# Patient Record
Sex: Male | Born: 1970 | Hispanic: No | Marital: Married | Smoking: Never smoker
Health system: Southern US, Community
[De-identification: ages and names within clinical notes are randomized; demographics above are authoritative.]

## PROBLEM LIST (undated history)

## (undated) DIAGNOSIS — I10 Essential (primary) hypertension: Secondary | ICD-10-CM

## (undated) DIAGNOSIS — E119 Type 2 diabetes mellitus without complications: Secondary | ICD-10-CM

---

## 2009-07-05 ENCOUNTER — Ambulatory Visit: Payer: Self-pay | Admitting: Internal Medicine

## 2009-07-05 DIAGNOSIS — I10 Essential (primary) hypertension: Secondary | ICD-10-CM | POA: Insufficient documentation

## 2009-07-05 LAB — CONVERTED CEMR LAB
Albumin: 4.5 g/dL (ref 3.5–5.2)
BUN: 8 mg/dL (ref 6–23)
Basophils Absolute: 0 10*3/uL (ref 0.0–0.1)
CO2: 30 meq/L (ref 19–32)
Eosinophils Absolute: 0.1 10*3/uL (ref 0.0–0.7)
GFR calc non Af Amer: 115.73 mL/min (ref >60)
Glucose, Bld: 128 mg/dL — ABNORMAL HIGH (ref 70–99)
HCT: 44 % (ref 39.0–52.0)
HDL: 48.2 mg/dL (ref >39.00)
Leukocytes, UA: NEGATIVE
Lymphs Abs: 1.7 10*3/uL (ref 0.7–4.0)
MCHC: 33.6 g/dL (ref 30.0–36.0)
Monocytes Absolute: 0.6 10*3/uL (ref 0.1–1.0)
Monocytes Relative: 8 % (ref 3.0–12.0)
Nitrite: NEGATIVE
Platelets: 295 10*3/uL (ref 150.0–400.0)
Potassium: 4.2 meq/L (ref 3.5–5.1)
RDW: 12.1 % (ref 11.5–14.6)
Specific Gravity, Urine: 1.015 (ref 1.000–1.030)
TSH: 1.22 microintl units/mL (ref 0.35–5.50)
Total Bilirubin: 1 mg/dL (ref 0.3–1.2)
Triglycerides: 241 mg/dL — ABNORMAL HIGH (ref 0.0–149.0)
Urobilinogen, UA: 0.2 (ref 0.0–1.0)
pH: 7 (ref 5.0–8.0)

## 2009-07-27 ENCOUNTER — Telehealth: Payer: Self-pay | Admitting: Internal Medicine

## 2009-08-30 ENCOUNTER — Ambulatory Visit: Payer: Self-pay | Admitting: Internal Medicine

## 2009-08-30 DIAGNOSIS — E781 Pure hyperglyceridemia: Secondary | ICD-10-CM | POA: Insufficient documentation

## 2009-08-30 DIAGNOSIS — E1139 Type 2 diabetes mellitus with other diabetic ophthalmic complication: Secondary | ICD-10-CM | POA: Insufficient documentation

## 2009-08-30 LAB — CONVERTED CEMR LAB
Calcium: 9.3 mg/dL (ref 8.4–10.5)
GFR calc non Af Amer: 117.32 mL/min (ref >60)
Hgb A1c MFr Bld: 7.2 % — ABNORMAL HIGH (ref 4.6–6.5)
Potassium: 4.5 meq/L (ref 3.5–5.1)
Sodium: 139 meq/L (ref 135–145)

## 2010-01-01 ENCOUNTER — Ambulatory Visit: Payer: Self-pay | Admitting: Internal Medicine

## 2010-01-01 LAB — CONVERTED CEMR LAB
ALT: 37 units/L (ref 0–53)
AST: 32 units/L (ref 0–37)
Alkaline Phosphatase: 37 units/L — ABNORMAL LOW (ref 39–117)
BUN: 13 mg/dL (ref 6–23)
Chloride: 105 meq/L (ref 96–112)
GFR calc non Af Amer: 109.1 mL/min (ref >60)
Glucose, Bld: 137 mg/dL — ABNORMAL HIGH (ref 70–99)
HDL goal, serum: 40 mg/dL
HDL: 37 mg/dL — ABNORMAL LOW (ref >39.00)
LDL Goal: 100 mg/dL
Potassium: 4.4 meq/L (ref 3.5–5.1)
Sodium: 140 meq/L (ref 135–145)
TSH: 1.21 microintl units/mL (ref 0.35–5.50)
Total Bilirubin: 0.9 mg/dL (ref 0.3–1.2)
VLDL: 68.6 mg/dL — ABNORMAL HIGH (ref 0.0–40.0)

## 2010-05-21 NOTE — Assessment & Plan Note (Signed)
Summary: f/u appt/#/cd   Vital Signs:  Patient profile:   40 year old male Height:      69 inches Weight:      170 pounds BMI:     25.20 O2 Sat:      98 % on Room air Temp:     97.7 degrees F oral Pulse rate:   79 / minute Pulse rhythm:   regular Resp:     16 per minute BP sitting:   128 / 86  (left arm) Cuff size:   large  Vitals Entered By: Rock Nephew CMA (Aug 30, 2009 8:54 AM)  Nutrition Counseling: Patient's BMI is greater than 25 and therefore counseled on weight management options.  O2 Flow:  Room air CC: follow-up visit Is Patient Diabetic? No Pain Assessment Patient in pain? no        Primary Care Provider:  Etta Grandchild MD  CC:  follow-up visit.  History of Present Illness:  Hypertension Follow-Up      This is a 40 year old man who presents for Hypertension follow-up.  The patient denies lightheadedness, urinary frequency, headaches, edema, impotence, rash, and fatigue.  The patient denies the following associated symptoms: chest pain, chest pressure, exercise intolerance, dyspnea, palpitations, syncope, leg edema, and pedal edema.  Compliance with medications (by patient report) has been near 100%.  The patient reports that dietary compliance has been good.  The patient reports exercising daily.  Adjunctive measures currently used by the patient include salt restriction and relaxation.    Preventive Screening-Counseling & Management  Alcohol-Tobacco     Alcohol drinks/day: 0     Smoking Status: never  Hep-HIV-STD-Contraception     Hepatitis Risk: no risk noted     HIV Risk: no risk noted     STD Risk: no risk noted      Sexual History:  currently monogamous.        Drug Use:  no.        Blood Transfusions:  no.    Clinical Review Panels:  Lipid Management   Cholesterol:  176 (07/05/2009)   HDL (good cholesterol):  48.20 (07/05/2009)  Diabetes Management   Creatinine:  0.8 (07/05/2009)  CBC   WBC:  7.2 (07/05/2009)   RBC:  4.92  (07/05/2009)   Hgb:  14.8 (07/05/2009)   Hct:  44.0 (07/05/2009)   Platelets:  295.0 (07/05/2009)   MCV  89.5 (07/05/2009)   MCHC  33.6 (07/05/2009)   RDW  12.1 (07/05/2009)   PMN:  66.4 (07/05/2009)   Lymphs:  24.3 (07/05/2009)   Monos:  8.0 (07/05/2009)   Eosinophils:  0.9 (07/05/2009)   Basophil:  0.4 (07/05/2009)  Complete Metabolic Panel   Glucose:  128 (07/05/2009)   Sodium:  140 (07/05/2009)   Potassium:  4.2 (07/05/2009)   Chloride:  105 (07/05/2009)   CO2:  30 (07/05/2009)   BUN:  8 (07/05/2009)   Creatinine:  0.8 (07/05/2009)   Albumin:  4.5 (07/05/2009)   Total Protein:  7.7 (07/05/2009)   Calcium:  9.5 (07/05/2009)   Total Bili:  1.0 (07/05/2009)   Alk Phos:  52 (07/05/2009)   SGPT (ALT):  31 (07/05/2009)   SGOT (AST):  25 (07/05/2009)   Medications Prior to Update: 1)  Diovan Hct 80-12.5 Mg Tabs (Valsartan-Hydrochlorothiazide) .... One By Mouth Each Morning For High Blood Pressure  Current Medications (verified): 1)  Diovan Hct 80-12.5 Mg Tabs (Valsartan-Hydrochlorothiazide) .... One By Mouth Each Morning For High Blood Pressure  Allergies (verified):  No Known Drug Allergies  Past History:  Past Medical History: Reviewed history from 07/05/2009 and no changes required. Unremarkable  Past Surgical History: Reviewed history from 07/05/2009 and no changes required. Denies surgical history  Family History: Reviewed history from 07/05/2009 and no changes required. Family History Diabetes 1st degree relative Family History Hypertension Family History Thyroid disease  Social History: Reviewed history from 07/05/2009 and no changes required. Occupation: owns gas stations Married Never Smoked Alcohol use-no Drug use-no Regular exercise-yes Hepatitis Risk:  no risk noted HIV Risk:  no risk noted STD Risk:  no risk noted  Review of Systems  The patient denies weight loss, weight gain, peripheral edema, abdominal pain, and hematuria.   Endo:   Denies cold intolerance, excessive hunger, excessive thirst, excessive urination, heat intolerance, polyuria, and weight change.  Physical Exam  General:  alert, well-developed, well-nourished, well-hydrated, appropriate dress, normal appearance, healthy-appearing, cooperative to examination, and good hygiene.   Mouth:  Oral mucosa and oropharynx without lesions or exudates.  Teeth in good repair. Neck:  supple, full ROM, no masses, no thyromegaly, no thyroid nodules or tenderness, no JVD, normal carotid upstroke, no carotid bruits, and no cervical lymphadenopathy.   Lungs:  normal respiratory effort, no intercostal retractions, no accessory muscle use, normal breath sounds, no dullness, no fremitus, no crackles, and no wheezes.   Heart:  normal rate, regular rhythm, no murmur, no gallop, no rub, and no JVD.   Abdomen:  soft, non-tender, normal bowel sounds, no distention, no masses, no guarding, no rigidity, no rebound tenderness, no inguinal hernia, no hepatomegaly, and no splenomegaly.   Msk:  normal ROM, no joint tenderness, no joint swelling, no joint warmth, no redness over joints, no joint deformities, no joint instability, and no crepitation.   Pulses:  R and L carotid,radial,femoral,dorsalis pedis and posterior tibial pulses are full and equal bilaterally Extremities:  No clubbing, cyanosis, edema, or deformity noted with normal full range of motion of all joints.   Neurologic:  No cranial nerve deficits noted. Station and gait are normal. Plantar reflexes are down-going bilaterally. DTRs are symmetrical throughout. Sensory, motor and coordinative functions appear intact. Skin:  Intact without suspicious lesions or rashes Psych:  Cognition and judgment appear intact. Alert and cooperative with normal attention span and concentration. No apparent delusions, illusions, hallucinations   Impression & Recommendations:  Problem # 1:  HYPERTRIGLYCERIDEMIA (ICD-272.1) Assessment New  Labs  Reviewed: SGOT: 25 (07/05/2009)   SGPT: 31 (07/05/2009)  Prior 10 Yr Risk Heart Disease: Not enough information (07/05/2009)   HDL:48.20 (07/05/2009)  Chol:176 (07/05/2009)  Trig:241.0 (07/05/2009)  Problem # 2:  HYPERGLYCEMIA (ICD-790.29) Assessment: New  Orders: Venipuncture (16109) TLB-BMP (Basic Metabolic Panel-BMET) (80048-METABOL) TLB-A1C / Hgb A1C (Glycohemoglobin) (83036-A1C)  Labs Reviewed: Creat: 0.8 (07/05/2009)     Problem # 3:  ESSENTIAL HYPERTENSION (ICD-401.9) Assessment: Improved  His updated medication list for this problem includes:    Diovan Hct 80-12.5 Mg Tabs (Valsartan-hydrochlorothiazide) ..... One by mouth each morning for high blood pressure  Orders: Venipuncture (60454) TLB-BMP (Basic Metabolic Panel-BMET) (80048-METABOL) TLB-A1C / Hgb A1C (Glycohemoglobin) (83036-A1C)  BP today: 128/86 Prior BP: 170/110 (07/05/2009)  Prior 10 Yr Risk Heart Disease: Not enough information (07/05/2009)  Labs Reviewed: K+: 4.2 (07/05/2009) Creat: : 0.8 (07/05/2009)   Chol: 176 (07/05/2009)   HDL: 48.20 (07/05/2009)   TG: 241.0 (07/05/2009)  Complete Medication List: 1)  Diovan Hct 80-12.5 Mg Tabs (Valsartan-hydrochlorothiazide) .... One by mouth each morning for high blood pressure  Patient Instructions: 1)  Please schedule a follow-up appointment in 3 months. 2)  It is important that you exercise regularly at least 20 minutes 5 times a week. If you develop chest pain, have severe difficulty breathing, or feel very tired , stop exercising immediately and seek medical attention. 3)  Check your Blood Pressure regularly. If it is above 140/90: you should make an appointment. Prescriptions: DIOVAN HCT 80-12.5 MG TABS (VALSARTAN-HYDROCHLOROTHIAZIDE) one by mouth each morning for high blood pressure  #84 x 0   Entered and Authorized by:   Etta Grandchild MD   Signed by:   Etta Grandchild MD on 08/30/2009   Method used:   Samples Given   RxID:   [id number]

## 2010-05-21 NOTE — Assessment & Plan Note (Signed)
Summary: blood pressure    Vital Signs:  Patient profile:   40 year old male Height:      69 inches Weight:      171.38 pounds BMI:     25.40 O2 Sat:      98 % on Room air Temp:     98.6 degrees F oral Pulse rate:   68 / minute Pulse rhythm:   regular Resp:     16 per minute BP sitting:   120 / 82  (left arm) Cuff size:   large  Vitals Entered By: Rock Nephew CMA (January 01, 2010 8:53 AM)  Nutrition Counseling: Patient's BMI is greater than 25 and therefore counseled on weight management options.  O2 Flow:  Room air CC: follow-up visit// BP recheck, Lipid Management, Hypertension Management Is Patient Diabetic? No Pain Assessment Patient in pain? no       Does patient need assistance? Functional Status Self care Ambulation Normal   Primary Care Provider:  Etta Grandchild MD  CC:  follow-up visit// BP recheck, Lipid Management, and Hypertension Management.  History of Present Illness:  Follow-Up Visit      This is a 40 year old man who presents for Follow-up visit.  The patient complains of high blood sugar symptoms, but denies chest pain, palpitations, dizziness, syncope, low blood sugar symptoms, edema, SOB, DOE, PND, and orthopnea.  Since the last visit the patient notes no new problems or concerns.  The patient reports taking meds as prescribed, monitoring BP, monitoring blood sugars, and dietary noncompliance.  When questioned about possible medication side effects, the patient notes none.    Hypertension History:      He denies headache, chest pain, palpitations, dyspnea with exertion, orthopnea, PND, peripheral edema, visual symptoms, neurologic problems, syncope, and side effects from treatment.  He notes no problems with any antihypertensive medication side effects.        Positive major cardiovascular risk factors include diabetes, hyperlipidemia, and hypertension.  Negative major cardiovascular risk factors include male age less than 52 years old,  negative family history for ischemic heart disease, and non-tobacco-user status.        Further assessment for target organ damage reveals no history of ASHD, cardiac end-organ damage (CHF/LVH), stroke/TIA, peripheral vascular disease, renal insufficiency, or hypertensive retinopathy.    Lipid Management History:      Positive NCEP/ATP III risk factors include diabetes and hypertension.  Negative NCEP/ATP III risk factors include male age less than 28 years old, no family history for ischemic heart disease, non-tobacco-user status, no ASHD (atherosclerotic heart disease), no prior stroke/TIA, no peripheral vascular disease, and no history of aortic aneurysm.        The patient states that he knows about the "Therapeutic Lifestyle Change" diet.  His compliance with the TLC diet is fair.  The patient expresses understanding of adjunctive measures for cholesterol lowering.  Adjunctive measures started by the patient include aerobic exercise, fiber, limit alcohol consumpton, and weight reduction.  He expresses no side effects from his lipid-lowering medication.  The patient denies any symptoms to suggest myopathy or liver disease.      Preventive Screening-Counseling & Management  Alcohol-Tobacco     Alcohol drinks/day: 0     Smoking Status: never     Tobacco Counseling: not indicated; no tobacco use  Hep-HIV-STD-Contraception     Hepatitis Risk: no risk noted     HIV Risk: no risk noted     STD Risk: no risk noted  Sexual History:  currently monogamous.        Drug Use:  no.        Blood Transfusions:  no.    Clinical Review Panels:  Immunizations   Last Tetanus Booster:  Tdap (01/01/2010)  Lipid Management   Cholesterol:  176 (07/05/2009)   HDL (good cholesterol):  48.20 (07/05/2009)  Diabetes Management   HgBA1C:  7.2 (08/30/2009)   Creatinine:  0.8 (08/30/2009)   Last Foot Exam:  yes (01/01/2010)  CBC   WBC:  7.2 (07/05/2009)   RBC:  4.92 (07/05/2009)   Hgb:  14.8  (07/05/2009)   Hct:  44.0 (07/05/2009)   Platelets:  295.0 (07/05/2009)   MCV  89.5 (07/05/2009)   MCHC  33.6 (07/05/2009)   RDW  12.1 (07/05/2009)   PMN:  66.4 (07/05/2009)   Lymphs:  24.3 (07/05/2009)   Monos:  8.0 (07/05/2009)   Eosinophils:  0.9 (07/05/2009)   Basophil:  0.4 (07/05/2009)  Complete Metabolic Panel   Glucose:  139 (08/30/2009)   Sodium:  139 (08/30/2009)   Potassium:  4.5 (08/30/2009)   Chloride:  102 (08/30/2009)   CO2:  28 (08/30/2009)   BUN:  15 (08/30/2009)   Creatinine:  0.8 (08/30/2009)   Albumin:  4.5 (07/05/2009)   Total Protein:  7.7 (07/05/2009)   Calcium:  9.3 (08/30/2009)   Total Bili:  1.0 (07/05/2009)   Alk Phos:  52 (07/05/2009)   SGPT (ALT):  31 (07/05/2009)   SGOT (AST):  25 (07/05/2009)   Medications Prior to Update: 1)  Diovan Hct 80-12.5 Mg Tabs (Valsartan-Hydrochlorothiazide) .... One By Mouth Each Morning For High Blood Pressure  Current Medications (verified): 1)  Losartan Potassium-Hctz 50-12.5 Mg Tabs (Losartan Potassium-Hctz) .... One By Mouth Once Daily For High Blood Pressure 2)  Onglyza 5 Mg Tabs (Saxagliptin Hcl) .... One By Mouth Once Daily For Diabetes 3)  Simcor 500-40 Mg Xr24h-Tab (Niacin-Simvastatin) .... One By Mouth Once Daily For Cholesterol  Allergies (verified): No Known Drug Allergies  Comments:  Nurse/Medical Assistant: The patient's medications and allergies were reviewed with the patient and were updated in the Medication and Allergy Lists. 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 Family History Diabetes 1st degree relative Family History Hypertension Family History Thyroid disease  Social History: Reviewed history from 07/05/2009 and no changes required. 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  Physical Exam  General:  alert, well-developed, well-nourished, well-hydrated, appropriate dress, normal appearance, healthy-appearing, cooperative to examination, and good hygiene.   Head:  normocephalic, atraumatic, no abnormalities observed, and no abnormalities palpated.   Mouth:  Oral mucosa and oropharynx without lesions or exudates.  Teeth in good repair. Neck:  supple, full ROM, no masses, no thyromegaly, no thyroid nodules or tenderness, no JVD, normal carotid upstroke, no carotid bruits, and no cervical lymphadenopathy.   Lungs:  normal respiratory effort, no intercostal retractions, no accessory muscle use, normal breath sounds, no dullness, no fremitus, no crackles, and no wheezes.   Heart:  normal rate, regular rhythm, no murmur, no gallop, no rub, and no JVD.   Abdomen:  soft, non-tender, normal bowel sounds, no distention, no masses, no guarding, no rigidity, no rebound tenderness, no inguinal hernia, no hepatomegaly, and no splenomegaly.   Msk:  normal ROM, no joint tenderness, no joint swelling, no joint warmth, no redness over joints, no joint deformities, no joint instability, and no crepitation.   Pulses:  R and L carotid,radial,femoral,dorsalis pedis and posterior tibial pulses are full and equal  bilaterally Extremities:  No clubbing, cyanosis, edema, or deformity noted with normal full range of motion of all joints.   Neurologic:  No cranial nerve deficits noted. Station and gait are normal. Plantar reflexes are down-going bilaterally. DTRs are symmetrical throughout. Sensory, motor and coordinative functions appear intact. Skin:  Intact without suspicious lesions or rashes Cervical Nodes:  no anterior cervical adenopathy and no posterior cervical adenopathy.   Axillary Nodes:  no R axillary adenopathy and no L axillary adenopathy.   Psych:  Cognition and judgment appear intact. Alert and cooperative with normal attention span and concentration. No apparent delusions, illusions, hallucinations  Diabetes Management Exam:    Foot Exam (with socks and/or shoes not present):       Sensory-Pinprick/Light touch:          Left medial foot (L-4): normal          Left dorsal foot (L-5): normal          Left lateral foot (S-1): normal          Right medial foot (L-4): normal          Right dorsal foot (L-5): normal          Right lateral foot (S-1): normal       Sensory-Monofilament:          Left foot: normal          Right foot: normal       Inspection:          Left foot: normal          Right foot: normal       Nails:          Left foot: normal          Right foot: normal   Impression & Recommendations:  Problem # 1:  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 One by mouth each morning for high blood pressure His updated medication list for this problem includes:    Losartan Potassium-hctz 50-12.5 Mg Tabs (Losartan potassium-hctz) ..... One by mouth once daily for high blood pressure    Onglyza 5 Mg Tabs (Saxagliptin hcl) ..... One by mouth once daily for diabetes  Orders: Venipuncture (54098) TLB-Lipid Panel  (80061-LIPID) TLB-BMP (Basic Metabolic Panel-BMET) (80048-METABOL) TLB-Hepatic/Liver Function Pnl (80076-HEPATIC) TLB-TSH (Thyroid Stimulating Hormone) (84443-TSH) TLB-A1C / Hgb A1C (Glycohemoglobin) (83036-A1C) Ophthalmology Referral (Ophthalmology) Diabetic Clinic Referral (Diabetic)  Problem # 2:  HYPERTRIGLYCERIDEMIA (ICD-272.1) Assessment: Unchanged  His updated medication list for this problem includes:    Simcor 500-40 Mg Xr24h-tab (Niacin-simvastatin) ..... One by mouth once daily for cholesterol  Orders: Venipuncture (11914) TLB-Lipid Panel (80061-LIPID) TLB-BMP (Basic Metabolic Panel-BMET) (80048-METABOL) TLB-Hepatic/Liver Function Pnl (80076-HEPATIC) TLB-TSH (Thyroid  Stimulating Hormone) (84443-TSH) TLB-A1C / Hgb A1C (Glycohemoglobin) (83036-A1C)  Labs Reviewed: SGOT: 25 (07/05/2009)   SGPT: 31 (07/05/2009)  Lipid Goals: Chol Goal: 200 (01/01/2010)   HDL Goal: 40 (01/01/2010)   LDL Goal: 100 (01/01/2010)   TG Goal: 150 (01/01/2010)  Prior 10 Yr Risk Heart Disease: Not enough information (07/05/2009)   HDL:48.20 (07/05/2009)  Chol:176 (07/05/2009)  Trig:241.0 (07/05/2009)  Problem # 3:  ESSENTIAL HYPERTENSION (ICD-401.9) Assessment: Improved  The following medications were removed from the medication list:    Diovan Hct 80-12.5 Mg Tabs (Valsartan-hydrochlorothiazide) ..... One by mouth each morning for high blood pressure His updated medication list for this problem includes:    Losartan Potassium-hctz 50-12.5 Mg Tabs (Losartan potassium-hctz) ..... One by mouth once daily for high blood pressure  Orders: Venipuncture (16109) TLB-Lipid Panel (80061-LIPID) TLB-BMP (Basic Metabolic Panel-BMET) (80048-METABOL) TLB-Hepatic/Liver Function Pnl (80076-HEPATIC) TLB-TSH (Thyroid Stimulating Hormone) (84443-TSH) TLB-A1C / Hgb A1C (Glycohemoglobin) (83036-A1C)  BP today: 120/82 Prior BP: 128/86 (08/30/2009)  Prior 10 Yr Risk Heart Disease: Not enough information  (07/05/2009)  Labs Reviewed: K+: 4.5 (08/30/2009) Creat: : 0.8 (08/30/2009)   Chol: 176 (07/05/2009)   HDL: 48.20 (07/05/2009)   TG: 241.0 (07/05/2009)  Complete Medication List: 1)  Losartan Potassium-hctz 50-12.5 Mg Tabs (Losartan potassium-hctz) .... One by mouth once daily for high blood pressure 2)  Onglyza 5 Mg Tabs (Saxagliptin hcl) .... One by mouth once daily for diabetes 3)  Simcor 500-40 Mg Xr24h-tab (Niacin-simvastatin) .... One by mouth once daily for cholesterol  Other Orders: Tdap => 61yrs IM (60454) Admin 1st Vaccine (09811)  Hypertension Assessment/Plan:      The patient's hypertensive risk group is category C: Target organ damage and/or diabetes.  Today's blood pressure is 120/82.  His blood pressure goal is < 140/90.  Lipid Assessment/Plan:      Based on NCEP/ATP III, the patient's risk factor category is "history of diabetes".  The patient's lipid goals are as follows: Total cholesterol goal is 200; LDL cholesterol goal is 100; HDL cholesterol goal is 40; Triglyceride goal is 150.    Patient Instructions: 1)  Please schedule a follow-up appointment in 2 months. 2)  It is important that you exercise regularly at least 20 minutes 5 times a week. If you develop chest pain, have severe difficulty breathing, or feel very tired , stop exercising immediately and seek medical attention. 3)  You need to lose weight. Consider a lower calorie diet and regular exercise.  4)  Take an Aspirin every day. 5)  Check your blood sugars regularly. If your readings are usually above 200 or below 70 you should contact our office. 6)  It is important that your Diabetic A1c level is checked every 3 months. 7)  See your eye doctor yearly to check for diabetic eye damage. 8)  Check your feet each night for sore areas, calluses or signs of infection. 9)  Check your Blood Pressure regularly. If it is above 130/80: you should make an appointment. Prescriptions: SIMCOR 500-40 MG XR24H-TAB  (NIACIN-SIMVASTATIN) One by mouth once daily for cholesterol  #70 x 0   Entered and Authorized by:   Etta Grandchild MD   Signed by:   Etta Grandchild MD on 01/01/2010   Method used:   Samples Given   RxID:   [id number] ONGLYZA 5 MG TABS (SAXAGLIPTIN HCL) One by mouth once daily for diabetes  #84 x 0   Entered and Authorized by:   Etta Grandchild MD   Signed  by:   Etta Grandchild MD on 01/01/2010   Method used:   Samples Given   RxID:   [id number] LOSARTAN POTASSIUM-HCTZ 50-12.5 MG TABS (LOSARTAN POTASSIUM-HCTZ) One by mouth once daily for high blood pressure  #30 x 11   Entered and Authorized by:   Etta Grandchild MD   Signed by:   Etta Grandchild MD on 01/01/2010   Method used:   Electronically to        [facility] [address] (retail)       [phone]       [place], [place]  [address]       Ph: [phone]       Fax: [phone]   RxID:   [id number]    Immunizations Administered:  Tetanus Vaccine:    Vaccine Type: Tdap    Site: right deltoid    Mfr: GlaxoSmithKline    Dose: 0.5 ml    Route: IM    Given by: Rock Nephew CMA    Exp. Date: 01/10/2012    Lot #: [id number]    VIS given: 03/08/08 version given January 01, 2010.  Not Administered:    Influenza Vaccine not given due to: declined by patient

## 2010-05-21 NOTE — Letter (Signed)
Summary: Lipid Letter  [facility]  [address] [place], [place] [address]   Phone: [phone]  Fax: [phone]    01/01/2010  [facility] [place] [address] [place], [place]  [address]  Dear Viviano Simas:  We have carefully reviewed your last lipid profile from  and the results are noted below with a summary of recommendations for lipid management.    Cholesterol:       181     Goal: <200   HDL "good" Cholesterol:   57.84     Goal: >40   LDL "bad" Cholesterol:   97     Goal: <100   Triglycerides:       343.0     Goal: <150        TLC Diet (Therapeutic Lifestyle Change): Saturated Fats & Transfatty acids should be kept < 7% of total calories ***Reduce Saturated Fats Polyunstaurated Fat can be up to 10% of total calories Monounsaturated Fat Fat can be up to 20% of total calories Total Fat should be no greater than 25-35% of total calories Carbohydrates should be 50-60% of total calories Protein should be approximately 15% of total calories Fiber should be at least 20-30 grams a day ***Increased fiber may help lower LDL Total Cholesterol should be < 200mg /day Consider adding plant stanol/sterols to diet (example: Benacol spread) ***A higher intake of unsaturated fat may reduce Triglycerides and Increase HDL    Adjunctive Measures (may lower LIPIDS and reduce risk of Heart Attack) include: Aerobic Exercise (20-30 minutes 3-4 times a week) Limit Alcohol Consumption Weight Reduction Aspirin 75-81 mg a day by mouth (if not allergic or contraindicated) Dietary Fiber 20-30 grams a day by mouth     Current Medications: 1)    Losartan Potassium-hctz 50-12.5 Mg Tabs (Losartan potassium-hctz) .... One by mouth once daily for high blood pressure 2)    Onglyza 5 Mg Tabs (Saxagliptin hcl) .... One by mouth once daily for diabetes 3)    Simcor 500-40 Mg Xr24h-tab (Niacin-simvastatin) .... One by mouth once daily for cholesterol  If you have any questions, please call. We  appreciate being able to work with you.   Sincerely,    [facility] Etta Grandchild MD

## 2010-05-21 NOTE — Assessment & Plan Note (Signed)
Summary: NEW / [employer] / # / CD   Vital Signs:  Patient profile:   40 year old male Height:      69 inches (175.26 cm) Weight:      166.75 pounds (75.80 kg) BMI:     24.71 O2 Sat:      98 % on Room air Temp:     97.1 degrees F (36.17 degrees C) oral Pulse rate:   71 / minute Pulse rhythm:   regular Resp:     16 per minute BP supine:   170 / 110  (right arm) BP sitting:   164 / 112  (left arm) Cuff size:   regular  Vitals Entered By: Rock Nephew CMA (July 05, 2009 10:34 AM) Taken by Sydell Axon SMA  O2 Flow:  Room air  CC: Pt c/o constant dull ache on the top/R side of his head, Hypertension Management   Primary Care Provider:  Etta Grandchild MD  CC:  Pt c/o constant dull ache on the top/R side of his head and Hypertension Management.  History of Present Illness: New to me he complains of having a high BP for about one year with numbers at home as high as 160/90. In the last week he has developed an intermittent right-sided HA that increases with emotional stress.  Hypertension History:      He complains of headache, but denies chest pain, palpitations, dyspnea with exertion, orthopnea, PND, peripheral edema, visual symptoms, neurologic problems, and syncope.        Positive major cardiovascular risk factors include hypertension.  Negative major cardiovascular risk factors include male age less than 38 years old, no history of diabetes or hyperlipidemia, negative family history for ischemic heart disease, and non-tobacco-user status.        Further assessment for target organ damage reveals no history of ASHD, cardiac end-organ damage (CHF/LVH), stroke/TIA, peripheral vascular disease, renal insufficiency, or hypertensive retinopathy.     Preventive Screening-Counseling & Management  Alcohol-Tobacco     Alcohol drinks/day: 0     Smoking Status: never  Caffeine-Diet-Exercise     Does Patient Exercise: yes  Safety-Violence-Falls     Seat Belt Use: yes  Helmet Use: yes     Firearms in the Home: no firearms in the home     Smoke Detectors: yes     Violence in the Home: no risk noted     Sexual Abuse: no      Sexual History:  currently monogamous.        Drug Use:  no.        Blood Transfusions:  no.    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 GU:  Denies dysuria, hematuria, nocturia, urinary frequency, and urinary hesitancy. Neuro:  Complains of headaches; denies brief paralysis,  difficulty with concentration, disturbances in coordination, falling down, memory loss, numbness, poor balance, seizures, sensation of room spinning, tingling, tremors, visual disturbances, and weakness.  Physical Exam  General:  alert, well-developed, well-nourished, well-hydrated, appropriate dress, normal appearance, healthy-appearing, cooperative to examination, and good hygiene.   Head:  normocephalic, atraumatic, no abnormalities observed, and no abnormalities palpated.   Eyes:  vision grossly intact, pupils equal, pupils round, pupils reactive to light, and a-v nicking.   Ears:  R ear normal and L ear normal.   Mouth:  Oral  mucosa and oropharynx without lesions or exudates.  Teeth in good repair. Neck:  supple, full ROM, no masses, no thyromegaly, no thyroid nodules or tenderness, no JVD, normal carotid upstroke, no carotid bruits, and no cervical lymphadenopathy.   Lungs:  normal respiratory effort, no intercostal retractions, no accessory muscle use, normal breath sounds, no dullness, no fremitus, no crackles, and no wheezes.   Heart:  normal rate, regular rhythm, no murmur, no gallop, no rub, and no JVD.   Abdomen:  soft, non-tender, normal bowel sounds, no distention, no masses, no guarding, no rigidity, no rebound tenderness, no inguinal hernia, no hepatomegaly, and no splenomegaly.   Msk:  normal ROM, no joint tenderness, no joint swelling, no joint warmth, no redness over joints, no joint deformities, no joint instability, and no crepitation.   Pulses:  R and L carotid,radial,femoral,dorsalis pedis and posterior tibial pulses are full and equal bilaterally Extremities:  No clubbing, cyanosis, edema, or deformity noted with normal full range of motion of all joints.   Neurologic:  No cranial nerve deficits noted. Station and gait are normal. Plantar reflexes are down-going bilaterally. DTRs are symmetrical throughout. Sensory, motor and coordinative functions appear intact. Skin:  Intact without suspicious lesions or rashes Cervical Nodes:  no anterior cervical adenopathy and no posterior cervical adenopathy.   Axillary Nodes:  no R axillary adenopathy and no L axillary adenopathy.   Psych:  Cognition and judgment appear intact. Alert and cooperative with normal attention span and concentration. No apparent delusions, illusions, hallucinations Additional Exam:  EKG is normal/ no LVH.   Impression & Recommendations:  Problem # 1:  ESSENTIAL HYPERTENSION (ICD-401.9) Assessment New  Orders: Venipuncture (04540) TLB-Lipid Panel (80061-LIPID) TLB-BMP (Basic Metabolic Panel-BMET) (80048-METABOL) TLB-CBC  Platelet - w/Differential (85025-CBCD) TLB-Hepatic/Liver Function Pnl (80076-HEPATIC) TLB-TSH (Thyroid Stimulating Hormone) (84443-TSH) TLB-Udip ONLY (81003-UDIP) EKG w/ Interpretation (93000)  His updated medication list for this problem includes:    Diovan Hct 80-12.5 Mg Tabs (Valsartan-hydrochlorothiazide) ..... One by mouth each morning for high blood pressure  Complete Medication List: 1)  Diovan Hct 80-12.5 Mg Tabs (Valsartan-hydrochlorothiazide) .... One by mouth each morning for high blood pressure  Hypertension Assessment/Plan:      The patient's hypertensive risk group is category A: No risk factors and no target organ damage.  Today's blood pressure is 164/112.  His blood pressure goal is < 140/90.  Patient Instructions: 1)  Please schedule a follow-up appointment in 1 month. 2)  It is important that you exercise regularly at least 20 minutes 5 times a week. If you develop chest pain, have severe difficulty breathing, or feel very tired , stop exercising immediately and seek medical attention. 3)  You need to lose weight. Consider a lower calorie diet and regular exercise.  4)  Check your Blood Pressure regularly. If it is above: you should make an appointment. Prescriptions: DIOVAN HCT 80-12.5 MG TABS (VALSARTAN-HYDROCHLOROTHIAZIDE) one by mouth each morning for high blood pressure  #84 x  0   Entered and Authorized by:   Etta Grandchild MD   Signed by:   Etta Grandchild MD on 07/05/2009   Method used:   Samples Given   RxID:   [id number]

## 2010-05-21 NOTE — Progress Notes (Signed)
Summary: RESULTS  Phone Note Call from Patient Call back at Home Phone [phone]   Summary of Call: Patient is requesting results of labs.  Initial call taken by: Lamar Sprinkles, CMA,  July 27, 2009 9:18 AM  Follow-up for Phone Call        blood sugar was a little high, cholesterol numbers are good, other labs look normal Follow-up by: Etta Grandchild MD,  July 27, 2009 9:24 AM  Additional Follow-up for Phone Call Additional follow up Details #1::        Patient notified per MD and copy mailed Additional Follow-up by: Rock Nephew CMA,  July 27, 2009 10:28 AM

## 2010-05-21 NOTE — Letter (Signed)
Summary: Results Follow-up Letter  [facility]  [address] [place], [place] [address]   Phone: [phone]  Fax: [phone]    08/30/2009  [address] [place], [place]  [address]  Dear Trevor Sanders,   The following are the results of your recent test(s):  Test     Result     Blood sugars   high Kidney     normal  _________________________________________________________  Please call for an appointment soon to discuss treatment of diabetes _________________________________________________________ _________________________________________________________ _________________________________________________________  Sincerely,  Sanda Linger MD [facility]

## 2011-02-03 ENCOUNTER — Other Ambulatory Visit: Payer: Self-pay | Admitting: Internal Medicine

## 2011-02-07 ENCOUNTER — Other Ambulatory Visit: Payer: Self-pay

## 2011-02-07 MED ORDER — LOSARTAN POTASSIUM-HCTZ 50-12.5 MG PO TABS: 1.0000 | ORAL_TABLET | Freq: Every day | ORAL | Status: DC

## 2011-02-07 NOTE — Telephone Encounter (Signed)
Pt states that he is completely out of medication and will not be able to make OV until end of Nov. #30 x 0 sent in, pt informed.

## 2012-03-09 ENCOUNTER — Ambulatory Visit (HOSPITAL_COMMUNITY)
Admission: RE | Admit: 2012-03-09 | Discharge: 2012-03-09 | Disposition: A | Discharge status: Home / Self Care | Payer: BC Managed Care – PPO | Source: Ambulatory Visit | Attending: Internal Medicine | Admitting: Internal Medicine

## 2012-03-09 ENCOUNTER — Other Ambulatory Visit: Payer: Self-pay | Admitting: Internal Medicine

## 2012-03-09 DIAGNOSIS — M549 Dorsalgia, unspecified: Secondary | ICD-10-CM

## 2012-03-09 DIAGNOSIS — M545 Low back pain, unspecified: Secondary | ICD-10-CM | POA: Insufficient documentation

## 2012-04-06 ENCOUNTER — Ambulatory Visit (HOSPITAL_COMMUNITY)
Admission: RE | Admit: 2012-04-06 | Discharge: 2012-04-06 | Disposition: A | Discharge status: Home / Self Care | Payer: BC Managed Care – PPO | Source: Ambulatory Visit | Attending: Internal Medicine | Admitting: Internal Medicine

## 2012-04-06 ENCOUNTER — Other Ambulatory Visit: Payer: Self-pay | Admitting: Internal Medicine

## 2012-04-06 DIAGNOSIS — M5412 Radiculopathy, cervical region: Secondary | ICD-10-CM | POA: Insufficient documentation

## 2012-04-06 DIAGNOSIS — M5410 Radiculopathy, site unspecified: Secondary | ICD-10-CM

## 2012-04-06 DIAGNOSIS — M5380 Other specified dorsopathies, site unspecified: Secondary | ICD-10-CM | POA: Insufficient documentation

## 2012-04-06 DIAGNOSIS — M542 Cervicalgia: Secondary | ICD-10-CM | POA: Insufficient documentation

## 2012-09-23 ENCOUNTER — Emergency Department (HOSPITAL_COMMUNITY)
Admission: EM | Admit: 2012-09-23 | Discharge: 2012-09-23 | Disposition: A | Discharge status: Home / Self Care | Payer: BC Managed Care – PPO | Source: Home / Self Care | Attending: Emergency Medicine | Admitting: Emergency Medicine

## 2012-09-23 ENCOUNTER — Encounter (HOSPITAL_COMMUNITY): Payer: Self-pay | Admitting: Emergency Medicine

## 2012-09-23 DIAGNOSIS — T148 Other injury of unspecified body region: Secondary | ICD-10-CM

## 2012-09-23 DIAGNOSIS — L0390 Cellulitis, unspecified: Secondary | ICD-10-CM

## 2012-09-23 DIAGNOSIS — L0291 Cutaneous abscess, unspecified: Secondary | ICD-10-CM

## 2012-09-23 DIAGNOSIS — W57XXXA Bitten or stung by nonvenomous insect and other nonvenomous arthropods, initial encounter: Secondary | ICD-10-CM

## 2012-09-23 MED ORDER — TRIAMCINOLONE ACETONIDE 0.5 % EX CREA: TOPICAL_CREAM | Freq: Three times a day (TID) | CUTANEOUS | Status: DC

## 2012-09-23 MED ORDER — SULFAMETHOXAZOLE-TRIMETHOPRIM 800-160 MG PO TABS: 1.0000 | ORAL_TABLET | Freq: Two times a day (BID) | ORAL | Status: DC

## 2012-09-23 NOTE — ED Notes (Signed)
Pt c/o possible insect bites on legs. Areas are draining clear fluid.  Gradually spreading. Some mild irritation. Symptoms present x 2 wks.  Pt has not tried any otc meds for symptoms.

## 2012-09-23 NOTE — ED Provider Notes (Signed)
Medical screening examination/treatment/procedure(s) were performed by non-physician practitioner and as supervising physician I was immediately available for consultation/collaboration.  Leslee Home, M.D.  Reuben Likes, MD 09/23/12 2203

## 2012-09-23 NOTE — ED Provider Notes (Signed)
History     CSN: [id number]  Arrival date & time 09/23/12  1736   First MD Initiated Contact with Patient 09/23/12 1912      Chief Complaint  Patient presents with  . Insect Bite    ? insect bites on legs draining clear fluid. mild irritation. gradually spreading.     (Consider location/radiation/quality/duration/timing/severity/associated sxs/prior treatment) HPI Comments: Pt presents for eval of itchy red rash on his legs.  First noticed 2 weeks ago as a small red spot on his left leg.  He scratched it and it became red and painful and also leaked out some clear fluid.  This eventually resolved but he has started to notice a few more spots on his legs.  He now has about 8 small red circular lesions spread about on both legs.  Denies fever, chills, red streaking, purulent discharge, or increasing pain.     History reviewed. No pertinent past medical history.  History reviewed. No pertinent past surgical history.  History reviewed. No pertinent family history.  History  Substance Use Topics  . Smoking status: Never Smoker   . Smokeless tobacco: Not on file  . Alcohol Use: No      Review of Systems  Constitutional: Negative for fever, chills and fatigue.  HENT: Negative for sore throat, neck pain and neck stiffness.   Eyes: Negative for visual disturbance.  Respiratory: Negative for cough and shortness of breath.   Cardiovascular: Negative for chest pain, palpitations and leg swelling.  Gastrointestinal: Negative for nausea, vomiting, abdominal pain, diarrhea and constipation.  Genitourinary: Negative for dysuria, urgency, frequency and hematuria.  Musculoskeletal: Negative for myalgias and arthralgias.  Skin: Positive for rash (see HPI).  Neurological: Negative for dizziness, weakness and light-headedness.    Allergies  Review of patient's allergies indicates no known allergies.  Home Medications   Current Outpatient Rx  Name  Route  Sig  Dispense  Refill  .  EXPIRED: losartan-hydrochlorothiazide (HYZAAR) 50-12.5 MG per tablet   Oral   Take 1 tablet by mouth daily.   30 tablet   0   . sulfamethoxazole-trimethoprim (SEPTRA DS) 800-160 MG per tablet   Oral   Take 1 tablet by mouth every 12 (twelve) hours.   14 tablet   0   . triamcinolone cream (KENALOG) 0.5 %   Topical   Apply topically 3 (three) times daily.   30 g   0     BP 111/85  Pulse 81  Temp(Src) 97.1 F (36.2 C) (Oral)  Resp 16  SpO2 100%  Physical Exam  Constitutional: He is oriented to person, place, and time. He appears well-developed and well-nourished. No distress.  HENT:  Head: Normocephalic and atraumatic.  Eyes: EOM are normal. Pupils are equal, round, and reactive to light.  Cardiovascular: Normal rate and regular rhythm.  Exam reveals no gallop and no friction rub.   No murmur heard. Pulmonary/Chest: Effort normal and breath sounds normal. No respiratory distress. He has no wheezes. He has no rales.  Abdominal: Soft. There is no tenderness.  Neurological: He is oriented to person, place, and time.  Skin: Skin is warm and dry. Rash noted. Rash is papular (Discrete erythematous papules with a central vesicle, c/w insect bite.  There is a small surrounding area of induration c/w infection) and vesicular.  Psychiatric: He has a normal mood and affect. Judgment normal.    ED Course  Procedures (including critical care time)  Labs Reviewed - No data to display No results found.  1. Insect bite   2. Cellulitis       MDM   This appears to be an insect bite that he scratches and causes subsequent mild cellulitis given the induration surrounding the vesicular lesions.  Will treat for both and have him f/u if this gets worse or fails to resolve.     Meds ordered this encounter  Medications  . sulfamethoxazole-trimethoprim (SEPTRA DS) 800-160 MG per tablet    Sig: Take 1 tablet by mouth every 12 (twelve) hours.    Dispense:  14 tablet    Refill:  0   . triamcinolone cream (KENALOG) 0.5 %    Sig: Apply topically 3 (three) times daily.    Dispense:  30 g    Refill:  0         Graylon Good, PA-C 09/23/12 2119

## 2012-10-04 ENCOUNTER — Encounter (HOSPITAL_COMMUNITY): Payer: Self-pay | Admitting: Emergency Medicine

## 2012-10-04 ENCOUNTER — Emergency Department (HOSPITAL_COMMUNITY)
Admission: EM | Admit: 2012-10-04 | Discharge: 2012-10-04 | Disposition: A | Discharge status: Home / Self Care | Payer: BC Managed Care – PPO | Source: Home / Self Care | Attending: Emergency Medicine | Admitting: Emergency Medicine

## 2012-10-04 DIAGNOSIS — L739 Follicular disorder, unspecified: Secondary | ICD-10-CM

## 2012-10-04 DIAGNOSIS — L738 Other specified follicular disorders: Secondary | ICD-10-CM

## 2012-10-04 HISTORY — DX: Essential (primary) hypertension: I10

## 2012-10-04 HISTORY — DX: Type 2 diabetes mellitus without complications: E11.9

## 2012-10-04 MED ORDER — CHLORHEXIDINE GLUCONATE 4 % EX LIQD: 60.0000 mL | Freq: Every day | CUTANEOUS | Status: DC | PRN

## 2012-10-04 MED ORDER — MUPIROCIN 2 % EX OINT
TOPICAL_OINTMENT | CUTANEOUS | Status: DC
Start: 2012-10-04 — End: 2015-08-09

## 2012-10-04 MED ORDER — DOXYCYCLINE HYCLATE 100 MG PO TABS: 100.0000 mg | ORAL_TABLET | Freq: Two times a day (BID) | ORAL | Status: DC

## 2012-10-04 NOTE — ED Provider Notes (Signed)
Chief Complaint:   Chief Complaint  Patient presents with  . Follow-up    History of Present Illness:   Trevor Sanders is a 42 year old male who was seen here on June 5 because of one or 2 bumps on his right leg. He was placed on Septra and triamcinolone. Ever since then the bumps have spread. Some of them are healing up and some of them are evolving. He has multiple small pustules. These are mildly itchy and somewhat painful. He denies any fever or chills. He does not have any lesions on his head, trunk, upper extremities.  Review of Systems:  Other than noted above, the patient denies any of the following symptoms: Systemic:  No fever, chills, sweats, weight loss, or fatigue. ENT:  No nasal congestion, rhinorrhea, sore throat, swelling of lips, tongue or throat. Resp:  No cough, wheezing, or shortness of breath. Skin:  No rash, itching, nodules, or suspicious lesions.  PMFSH:  Past medical history, family history, social history, meds, and allergies were reviewed. He has high blood pressure and takes losartan. He finished up a course of steroids in the end of May for a back and neck problem.  Physical Exam:   Vital signs:  BP 140/89  Pulse 96  Temp(Src) 98.3 F (36.8 C) (Oral)  Resp 16  SpO2 100% Gen:  Alert, oriented, in no distress. ENT:  Pharynx clear, no intraoral lesions, moist mucous membranes. Lungs:  Clear to auscultation. Skin:  He has multiple small pustules on both legs. These are widely scattered. They're in various stages of evolution. Some crusted over. Some are pustular form.  Course in Urgent Care Center:   One of the pustules is seen the most recent in the left groin was prepped with alcohol, unroofed with a #11 scalpel blade, and the pus was cultured.  Assessment:  The encounter diagnosis was Folliculitis.  This appears to be a folliculitis. Obviously has not responded to Septra. Will stop this and have him switch to doxycycline and he was also given Hibiclens and  mupirocin to apply topically to lesions and also to his nares 3 times a day for a month.  Plan:   1.  The following meds were prescribed:   Discharge Medication List as of 10/04/2012  6:02 PM    START taking these medications   Details  chlorhexidine (HIBICLENS) 4 % external liquid Apply 60 mLs (4 application total) topically daily as needed., Starting 10/04/2012, Until Discontinued, Print    doxycycline (VIBRA-TABS) 100 MG tablet Take 1 tablet (100 mg total) by mouth 2 (two) times daily., Starting 10/04/2012, Until Discontinued, Print    mupirocin ointment (BACTROBAN) 2 % Apply TID to lesions on legs until clear and TID to both nostrils for 1 month., Print       2.  The patient was instructed in symptomatic care and handouts were given. 3.  The patient was told to return if becoming worse in any way, if no better in 3 or 4 days, and given some red flag symptoms such as worsening rash or fever that would indicate earlier return. 4.  Follow up here if necessary.     Reuben Likes, MD 10/04/12 2112

## 2012-10-04 NOTE — ED Notes (Signed)
Patient seen 6/5 for rash/bites on legs and treated with cream and antibiotic.  Seemed to improve.  Finished antibiotic and increase in the number of bites reoccurred.

## 2012-10-06 ENCOUNTER — Encounter (HOSPITAL_COMMUNITY): Payer: Self-pay | Admitting: Emergency Medicine

## 2012-10-06 ENCOUNTER — Emergency Department (HOSPITAL_COMMUNITY)
Admission: EM | Admit: 2012-10-06 | Discharge: 2012-10-06 | Disposition: A | Discharge status: Home / Self Care | Payer: BC Managed Care – PPO | Source: Home / Self Care | Attending: Emergency Medicine | Admitting: Emergency Medicine

## 2012-10-06 DIAGNOSIS — R21 Rash and other nonspecific skin eruption: Secondary | ICD-10-CM

## 2012-10-06 NOTE — ED Notes (Signed)
Chart review.

## 2012-10-06 NOTE — ED Notes (Signed)
Patient called about his lab reports. Preliminary reports only noted in computer C/o he is not getting any better and would like to discuss w Dr Lorenz Coaster. Will advise MD on arrival p 5 Pm

## 2012-10-06 NOTE — ED Provider Notes (Signed)
History     CSN: [id number]  Arrival date & time 10/06/12  1058   First MD Initiated Contact with Patient 10/06/12 1130      Chief Complaint  Patient presents with  . Rash    (Consider location/radiation/quality/duration/timing/severity/associated sxs/prior treatment) HPI Comments: Patient returns today describing this despite his topical and oral antibiotic he has noticed new lesions it seemed to be extending towards his inguinal area. " Have develop some new blisters". Patient had been seen at our urgent care facility on 2 different occasions been treated for suspected insect bites with secondary infection. A culture sample was obtained on his last visit. Patient was informed that so far there is no growth. Patient seems to suspect that this rash appeared after he completed a two-week course of prednisone for back problems.  Denies any new symptoms such as fevers, chills generalized malaise arthralgias or changes in appetite. He continues to use both the midportion and the doxycycline.  He describes also that has some tenderness and pain circumferential to his left lower extremity including his calf region.  Patient is a 42 y.o. male presenting with rash. The history is provided by the patient.  Rash Pain location: Bilateral lower extremities. Pain severity:  Mild Onset quality:  Gradual Timing:  Constant Progression:  Worsening Context: recent illness   Context: not medication withdrawal   Relieved by:  Nothing Worsened by:  Nothing tried Ineffective treatments:  None tried Associated symptoms: no anorexia, no chills, no cough, no fever, no shortness of breath, no sore throat and no vomiting   Risk factors: not elderly, no NSAID use and not obese     Past Medical History  Diagnosis Date  . Hypertension   . Diabetes mellitus without complication     History reviewed. No pertinent past surgical history.  No family history on file.  History  Substance Use Topics  .  Smoking status: Never Smoker   . Smokeless tobacco: Not on file  . Alcohol Use: No      Review of Systems  Constitutional: Negative for fever, chills, activity change and appetite change.  HENT: Negative for sore throat and rhinorrhea.   Respiratory: Negative for cough and shortness of breath.   Gastrointestinal: Negative for vomiting and anorexia.  Musculoskeletal: Negative for joint swelling and arthralgias.  Skin: Positive for rash. Negative for color change, pallor and wound.    Allergies  Review of patient's allergies indicates no known allergies.  Home Medications   Current Outpatient Rx  Name  Route  Sig  Dispense  Refill  . chlorhexidine (HIBICLENS) 4 % external liquid   Topical   Apply 60 mLs (4 application total) topically daily as needed.   240 mL   2   . doxycycline (VIBRA-TABS) 100 MG tablet   Oral   Take 1 tablet (100 mg total) by mouth 2 (two) times daily.   20 tablet   2   . EXPIRED: losartan-hydrochlorothiazide (HYZAAR) 50-12.5 MG per tablet   Oral   Take 1 tablet by mouth daily.   30 tablet   0   . losartan-hydrochlorothiazide (HYZAAR) 50-12.5 MG per tablet   Oral   Take 1 tablet by mouth daily.         . mupirocin ointment (BACTROBAN) 2 %      Apply TID to lesions on legs until clear and TID to both nostrils for 1 month.   22 g   2   . sulfamethoxazole-trimethoprim (SEPTRA DS) 800-160 MG per  tablet   Oral   Take 1 tablet by mouth every 12 (twelve) hours.   14 tablet   0   . triamcinolone cream (KENALOG) 0.5 %   Topical   Apply topically 3 (three) times daily.   30 g   0     BP 131/81  Pulse 84  Temp(Src) 98.2 F (36.8 C) (Oral)  Resp 16  SpO2 100%  Physical Exam  Constitutional: Vital signs are normal. He appears well-developed and well-nourished.  Non-toxic appearance. No distress.  Musculoskeletal:       Right shoulder: He exhibits pain. He exhibits normal range of motion, no tenderness, no swelling, no effusion, no  crepitus, normal pulse and normal strength.       Left knee: He exhibits normal range of motion, no swelling and no bony tenderness. Tenderness found. No patellar tendon tenderness noted.       Legs: Skin: Rash noted. Rash is papular and vesicular. There is erythema.       ED Course  Procedures (including critical care time)  Labs Reviewed  HERPES SIMPLEX VIRUS CULTURE   No results found.   1. Diffuse papular eruption       MDM  Lower extremity- macular/papular/vesicular eruptions are different stages of healing. HSV? Obtained Sample for viral Cx- ( As of today no growth on bacterial media) Patient currently taking  oral antibiotics and using topical antibiotics.    Jimmie Molly, MD 10/06/12 1416

## 2012-10-06 NOTE — ED Notes (Addendum)
Pt is here for persistent rash/insect bites on both legs... Seen here at [facility] for same sxs x2... Wanting to know culture results Sxs today include: calf muscles hard and tender, multiple new rashes... Denies: fevers Tolerated antibiotics well  He is alert and oriented w/no signs of acute distress.

## 2012-10-08 LAB — CULTURE, ROUTINE-ABSCESS
Culture: NO GROWTH
Gram Stain: NONE SEEN

## 2012-10-08 LAB — HERPES SIMPLEX VIRUS CULTURE: Culture: NOT DETECTED

## 2012-10-15 NOTE — ED Notes (Signed)
Chart review.

## 2015-07-10 ENCOUNTER — Other Ambulatory Visit: Payer: Self-pay | Admitting: Internal Medicine

## 2015-07-10 DIAGNOSIS — N201 Calculus of ureter: Secondary | ICD-10-CM

## 2015-07-18 ENCOUNTER — Encounter (HOSPITAL_COMMUNITY): Payer: Self-pay

## 2015-07-18 ENCOUNTER — Ambulatory Visit (HOSPITAL_COMMUNITY)
Admission: RE | Admit: 2015-07-18 | Discharge: 2015-07-18 | Disposition: A | Discharge status: Home / Self Care | Payer: BLUE CROSS/BLUE SHIELD | Source: Ambulatory Visit | Attending: Internal Medicine | Admitting: Internal Medicine

## 2015-07-18 DIAGNOSIS — R319 Hematuria, unspecified: Secondary | ICD-10-CM | POA: Insufficient documentation

## 2015-07-18 DIAGNOSIS — N201 Calculus of ureter: Secondary | ICD-10-CM | POA: Diagnosis present

## 2015-07-18 DIAGNOSIS — N132 Hydronephrosis with renal and ureteral calculous obstruction: Secondary | ICD-10-CM | POA: Diagnosis not present

## 2015-07-18 DIAGNOSIS — R109 Unspecified abdominal pain: Secondary | ICD-10-CM | POA: Diagnosis present

## 2015-07-31 DIAGNOSIS — N201 Calculus of ureter: Secondary | ICD-10-CM | POA: Diagnosis not present

## 2015-07-31 DIAGNOSIS — Z0000 Encounter for general adult medical examination without abnormal findings: Secondary | ICD-10-CM | POA: Diagnosis not present

## 2015-08-06 DIAGNOSIS — N201 Calculus of ureter: Secondary | ICD-10-CM | POA: Diagnosis not present

## 2015-08-07 ENCOUNTER — Other Ambulatory Visit: Payer: Self-pay | Admitting: Urology

## 2015-08-07 DIAGNOSIS — N201 Calculus of ureter: Secondary | ICD-10-CM | POA: Diagnosis not present

## 2015-08-07 DIAGNOSIS — Z0000 Encounter for general adult medical examination without abnormal findings: Secondary | ICD-10-CM | POA: Diagnosis not present

## 2015-08-08 NOTE — H&P (Signed)
Reason For Visit F/u after CT scan   Active Problems Problems  1. Hydronephrosis, left (N13.30)   Assessed By: Ihor Gully (Urology); Last Assessed: 19 Jul 2015 2. Ureteral calculus, left (N20.1)   Assessed By: Jethro Bolus (Urology); Last Assessed: 07 Aug 2015  History of Present Illness     45 yo male returns today for f/u after CT scan. Hx of a Lt ureteral stone. He has not passed the stone that he is aware of. He continues to have intermittent Lt flank & LLQ abdominal pain. He was last seen by Dr. Ronne Binning on 07/19/15 as an emergent work in today for ureteral calculus. He was experiencing left flank pain and a CT scan was obtained on 07/18/15 which revealed a 6 mm long by 4x3 mm wide stone in the left proximal ureter with Hounsfield units of a proximally 1000. There was mild hydronephrosis associated. He has very mild dull nonradiating left flank pain today. He is not on alpha blocker or narcotics. He has associated nausea.      Hypogonadism: Originally referred by Dr. Doristine Section for further evaluation of testicular paresthesias. Taking Clomid and sildenafil.    Erectile dysfunction: He was noted to have erectile dysfunction in 6/13. This has been managed with phosphodiesterase inhibitor therapy in the past.   Past Medical History Problems  1. History of hypertension (Z86.79)  Surgical History Problems  1. History of No Surgical Problems  Current Meds 1. Jentadueto 2.5-500 MG Oral Tablet;  Therapy: (Recorded:30Mar2017) to Recorded 2. Ondansetron 4 MG Oral Tablet Dispersible; TAKE 4 MG 3 times daily PRN nausea;  Therapy: 30Mar2017 to (Last Rx:30Mar2017) Ordered 3. Oxycodone-Acetaminophen 5-325 MG Oral Tablet; TAKE 1 TABLET EVERY 4 TO 6  HOURS AS NEEDED FOR PAIN;  Therapy: 30Mar2017 to (Evaluate:09Apr2017); Last Rx:30Mar2017 Ordered 4. Sildenafil Citrate 20 MG Oral Tablet; Take 2-5 tablets as needed;  Therapy: 14Jul2014 to (Last Rx:28Oct2015)  Requested for:  28Oct2015 Ordered 5. Tamsulosin HCl - 0.4 MG Oral Capsule; TAKE 1 CAPSULE Daily;  Therapy: 30Mar2017 to (Evaluate:29Apr2017); Last Rx:30Mar2017 Ordered  Allergies Medication  1. PredniSONE (Pak) TABS  Family History Problems  1. Family history of Family Health Status - Father's Age : Father 2. Family history of Family Health Status - Mother's Age 66. Family history of Family Health Status Number Of Children   1 son 4. Family history of No Significant Family History  Social History Problems  1. Denied: History of Alcohol Use (History) 2. Caffeine Use   1 per day 3. Marital History - Divorced 4. Never A Smoker  Review of Systems Genitourinary, constitutional, skin, eye, otolaryngeal, hematologic/lymphatic, cardiovascular, pulmonary, endocrine, musculoskeletal, gastrointestinal, neurological and psychiatric system(s) were reviewed and pertinent findings if present are noted and are otherwise negative.  Genitourinary: hematuria.  Gastrointestinal: flank pain and abdominal pain.  Musculoskeletal: back pain.    Vitals Vital Signs [Data Includes: Last 1 Day]  Recorded: 18Apr2017 10:25AM  Blood Pressure: 165 / 84 Temperature: 99 F Heart Rate: 92  Physical Exam Constitutional: Well nourished and well developed . No acute distress.  ENT:. The ears and nose are normal in appearance.  Neck: The appearance of the neck is normal and no neck mass is present.  Pulmonary: No respiratory distress and normal respiratory rhythm and effort.  Cardiovascular: Heart rate and rhythm are normal . No peripheral edema.  Abdomen: The abdomen is soft and nontender. No masses are palpated. No CVA tenderness. No hernias are palpable. No hepatosplenomegaly noted.  Genitourinary: Examination of the penis  demonstrates no discharge, no masses, no lesions and a normal meatus. The scrotum is without lesions. The right epididymis is palpably normal and non-tender. The left epididymis is palpably normal and  non-tender. The right testis is non-tender and without masses. The left testis is non-tender and without masses.  Lymphatics: The femoral and inguinal nodes are not enlarged or tender.  Skin: Normal skin turgor, no visible rash and no visible skin lesions.  Neuro/Psych:. Mood and affect are appropriate.    Results/Data Urine [Data Includes: Last 1 Day]   18Apr2017  COLOR YELLOW   APPEARANCE CLEAR   SPECIFIC GRAVITY 1.015   pH 6.0   GLUCOSE 3+   BILIRUBIN NEGATIVE   KETONE NEGATIVE   BLOOD TRACE   PROTEIN NEGATIVE   NITRITE NEGATIVE   LEUKOCYTE ESTERASE NEGATIVE   SQUAMOUS EPITHELIAL/HPF 0-5 HPF  WBC 0-5 WBC/HPF  RBC 0-2 RBC/HPF  BACTERIA NONE SEEN HPF  CRYSTALS NONE SEEN HPF  CASTS NONE SEEN LPF  Yeast NONE SEEN HPF  Selected Results  AU CT-STONE PROTOCOL 17Apr2017 12:00AM Jethro Bolus   Test Name Result Flag Reference  CT-STONE PROTOCOL (Report)    ** RADIOLOGY REPORT BY [facility] **   CLINICAL DATA: Nausea and vomiting. No fever. LEFT ureteral stone in LEFT lower quadrant pain for 3 weeks.  EXAM: CT ABDOMEN AND PELVIS WITHOUT CONTRAST  TECHNIQUE: Multidetector CT imaging of the abdomen and pelvis was performed following the standard protocol without IV contrast.  COMPARISON: CT abdomen 07/18/2015  FINDINGS: Lower chest: Lung bases are clear.  Hepatobiliary: Diffuse low-attenuation liver consistent hepatic steatosis. Without dilatation  Pancreas: Pancreas is normal. No ductal dilatation. No pancreatic inflammation.  Spleen: Normal spleen  Adrenals/urinary tract: Adrenal glands normal.  There is continued mild hydronephrosis and proximal hydroureter of the LEFT collecting system. There is a partially obstructing calculus in the proximal LEFT ureter measuring 5 mm on image 32, series 2. This calculus has not changed position compared to CT of 07/18/2015. No distal LEFT ureteral calculus. No bladder calculi.  No RIGHT nephrolithiasis  or ureterolithiasis.  Stomach/Bowel: Stomach, small bowel, appendix, and cecum are normal. The colon and rectosigmoid colon are normal.  Vascular/Lymphatic: Abdominal aorta is normal caliber. There is no retroperitoneal or periportal lymphadenopathy. No pelvic lymphadenopathy.  Reproductive: Prostate gland normal.  Other: No free fluid.  Musculoskeletal: No aggressive osseous lesion.  IMPRESSION: No change in position of partially obstructing calculus in the proximal LEFT ureter.   Electronically Signed  By: Genevive Bi M.D.  On: 08/06/2015 10:19    KUB today continues to show 5mm L mid ureteral stone.   CT scan reviewed. Stone shows no progression.  Assessment Assessed  1. Ureteral calculus, left (N20.1)  1. Non progression of 5mm L mid-upper ureteral stone. I have reviewed the CT scans and today's KUB with the patient, and believe he is a lithotripsy candidate. he could have ureteroscopy and laser also, and we have discussed the alternatives. He wuld like to have lithotripsy,and will stay on his tamsulosin. He wants to have lithotripsy this week, and will try to get on the schedule this week.   Plan Health Maintenance  1. UA With REFLEX; [Do Not Release]; Status:Resulted - Requires Verification;   Done:  18Apr2017 09:59AM Ureteral calculus, left  2. Follow-up Schedule Surgery Office  Follow-up  Status: Hold For - Appointment   Requested for: 18Apr2017 3. KUB; Status:Resulted - Requires Verification;   Done: 18Apr2017 10:52AM  1. Continue tamsulosin  2. Schedule lithotripsy.  Discussion/Summary cc: Dr. Margaretmary BayleyPreston Clark     Signatures Electronically signed by : Jethro BolusSigmund Tannenbaum, M.D.; Aug 07 2015 11:17AM EST  Addendum: I reviewed the chart, labs and imaging.  UA was negative.  5 mm left proximal ureteral stone.

## 2015-08-09 ENCOUNTER — Encounter (HOSPITAL_COMMUNITY)
Admission: RE | Disposition: A | Discharge status: Home / Self Care | Payer: Self-pay | Source: Ambulatory Visit | Attending: Urology

## 2015-08-09 ENCOUNTER — Ambulatory Visit (HOSPITAL_COMMUNITY): Payer: BLUE CROSS/BLUE SHIELD

## 2015-08-09 ENCOUNTER — Ambulatory Visit (HOSPITAL_COMMUNITY)
Admission: RE | Admit: 2015-08-09 | Discharge: 2015-08-09 | Disposition: A | Discharge status: Home / Self Care | Payer: BLUE CROSS/BLUE SHIELD | Source: Ambulatory Visit | Attending: Urology | Admitting: Urology

## 2015-08-09 DIAGNOSIS — N201 Calculus of ureter: Secondary | ICD-10-CM | POA: Diagnosis not present

## 2015-08-09 DIAGNOSIS — E119 Type 2 diabetes mellitus without complications: Secondary | ICD-10-CM | POA: Diagnosis not present

## 2015-08-09 DIAGNOSIS — Z79899 Other long term (current) drug therapy: Secondary | ICD-10-CM | POA: Insufficient documentation

## 2015-08-09 DIAGNOSIS — N132 Hydronephrosis with renal and ureteral calculous obstruction: Secondary | ICD-10-CM | POA: Insufficient documentation

## 2015-08-09 DIAGNOSIS — I10 Essential (primary) hypertension: Secondary | ICD-10-CM | POA: Diagnosis not present

## 2015-08-09 DIAGNOSIS — N529 Male erectile dysfunction, unspecified: Secondary | ICD-10-CM | POA: Insufficient documentation

## 2015-08-09 DIAGNOSIS — E291 Testicular hypofunction: Secondary | ICD-10-CM | POA: Insufficient documentation

## 2015-08-09 LAB — GLUCOSE, CAPILLARY: GLUCOSE-CAPILLARY: 143 mg/dL — AB (ref 65–99)

## 2015-08-09 SURGERY — LITHOTRIPSY, ESWL
Anesthesia: LOCAL | Laterality: Left

## 2015-08-09 MED ORDER — DIPHENHYDRAMINE HCL 25 MG PO CAPS
25.0000 mg | ORAL_CAPSULE | ORAL | Status: AC
  Administered 2015-08-09: 25 mg via ORAL
  Filled 2015-08-09: qty 1

## 2015-08-09 MED ORDER — DIAZEPAM 5 MG PO TABS
10.0000 mg | ORAL_TABLET | ORAL | Status: AC
  Administered 2015-08-09: 10 mg via ORAL
  Filled 2015-08-09: qty 2

## 2015-08-09 MED ORDER — CIPROFLOXACIN HCL 500 MG PO TABS
500.0000 mg | ORAL_TABLET | ORAL | Status: AC
  Administered 2015-08-09: 500 mg via ORAL
  Filled 2015-08-09: qty 1

## 2015-08-09 MED ORDER — OXYCODONE-ACETAMINOPHEN 5-325 MG PO TABS: 1.0000 | ORAL_TABLET | Freq: Four times a day (QID) | ORAL | Status: AC | PRN

## 2015-08-09 MED ORDER — SODIUM CHLORIDE 0.9 % IV SOLN
INTRAVENOUS | Status: DC
  Administered 2015-08-09: 08:00:00 via INTRAVENOUS

## 2015-08-09 MED ORDER — OXYCODONE-ACETAMINOPHEN 5-325 MG PO TABS
1.0000 | ORAL_TABLET | ORAL | Status: AC
  Administered 2015-08-09: 1 via ORAL
  Filled 2015-08-09: qty 1

## 2015-08-09 MED ORDER — DIAZEPAM 2 MG PO TABS: 5.0000 mg | ORAL_TABLET | ORAL | Status: DC

## 2015-08-09 MED ORDER — DOXAZOSIN MESYLATE 2 MG PO TABS: 2.0000 mg | ORAL_TABLET | Freq: Every day | ORAL | Status: AC

## 2015-08-09 NOTE — Discharge Instructions (Signed)
Lithotripsy, Care After °Refer to this sheet in the next few weeks. These instructions provide you with information on caring for yourself after your procedure. Your health care provider may also give you more specific instructions. Your treatment has been planned according to current medical practices, but problems sometimes occur. Call your health care provider if you have any problems or questions after your procedure. °WHAT TO EXPECT AFTER THE PROCEDURE  °· Your urine may have a red tinge for a few days after treatment. Blood loss is usually minimal. °· You may have soreness in the back or flank area. This usually goes away after a few days. The procedure can cause blotches or bruises on the back where the pressure wave enters the skin. These marks usually cause only minimal discomfort and should disappear in a short time. °· Stone fragments should begin to pass within 24 hours of treatment. However, a delayed passage is not unusual. °· You may have pain, discomfort, and feel sick to your stomach (nauseated) when the crushed fragments of stone are passed down the tube from the kidney to the bladder. Stone fragments can pass soon after the procedure and may last for up to 4-8 weeks. °· A small number of patients may have severe pain when stone fragments are not able to pass, which leads to an obstruction. °· If your stone is greater than 1 inch (2.5 cm) in diameter or if you have multiple stones that have a combined diameter greater than 1 inch (2.5 cm), you may require more than one treatment. °· If you had a stent placed prior to your procedure, you may experience some discomfort, especially during urination. You may experience the pain or discomfort in your flank or back, or you may experience a sharp pain or discomfort at the base of your penis or in your lower abdomen. The discomfort usually lasts only a few minutes after urinating. °HOME CARE INSTRUCTIONS  °· Rest at home until you feel your energy  improving. °· Only take over-the-counter or prescription medicines for pain, discomfort, or fever as directed by your health care provider. Depending on the type of lithotripsy, you may need to take antibiotics and anti-inflammatory medicines for a few days. °· Drink enough water and fluids to keep your urine clear or pale yellow. This helps "flush" your kidneys. It helps pass any remaining pieces of stone and prevents stones from coming back. °· Most people can resume daily activities within 1-2 days after standard lithotripsy. It can take longer to recover from laser and percutaneous lithotripsy. °· Strain all urine through the provided strainer. Keep all particulate matter and stones for your health care provider to see. The stone may be as small as a grain of salt. It is very important to use the strainer each and every time you pass your urine. Any stones that are found can be sent to a medical lab for examination. °· Visit your health care provider for a follow-up appointment in a few weeks. Your doctor may remove your stent if you have one. Your health care provider will also check to see whether stone particles still remain. °SEEK MEDICAL CARE IF:  °· Your pain is not relieved by medicine. °· You have a lasting nauseous feeling. °· You feel there is too much blood in the urine. °· You develop persistent problems with frequent or painful urination that does not at least partially improve after 2 days following the procedure. °· You have a congested cough. °· You feel   lightheaded. °· You develop a rash or any other signs that might suggest an allergic problem. °· You develop any reaction or side effects to your medicine(s). °SEEK IMMEDIATE MEDICAL CARE IF:  °· You experience severe back or flank pain or both. °· You see nothing but blood when you urinate. °· You cannot pass any urine at all. °· You have a fever or shaking chills. °· You develop shortness of breath, difficulty breathing, or chest pain. °· You  develop vomiting that will not stop after 6-8 hours. °· You have a fainting episode. °  °This information is not intended to replace advice given to you by your health care provider. Make sure you discuss any questions you have with your health care provider. °  °Document Released: 04/27/2007 Document Revised: 12/27/2014 Document Reviewed: 10/21/2012 °Elsevier Interactive Patient Education ©2016 [employer] ° °

## 2015-08-09 NOTE — Op Note (Signed)
Left ESWL   Left 5 mm proximal ureteral stone. Fragmented well. Pt tolerated well.

## 2015-08-09 NOTE — Progress Notes (Signed)
BP remains elevated. Pt states he will be contacting his PCP about this and to discuss sleep apnea evaluation

## 2015-08-09 NOTE — Progress Notes (Addendum)
Pt states his PCP ( D rPreston Clark)took him off this BP medication "a year ago because my BP was too low" at the office visit 04/2015 he was given some samples( unsure of what the name is) of a BP medication but had not started them because [place].Pt states when he checks it at home his BP is 130/98 range

## 2015-08-09 NOTE — Interval H&P Note (Signed)
History and Physical Interval Note:  08/09/2015 8:56 AM  Shona NeedlesAjay Nonaka  has presented today for surgery, with the diagnosis of LEFT UPPER URETERAL STONE   The various methods of treatment have been discussed with the patient and family. After consideration of risks, benefits and other options for treatment, the patient has consented to  Procedure(s): LEFT EXTRACORPOREAL SHOCK WAVE LITHOTRIPSY (ESWL) (Left) as a surgical intervention .  The patient's history has been reviewed, patient examined, no change in status, stable for surgery.  I have reviewed the patient's chart, KUB, CT and labs. He is well - no dysuria or fever. I discussed with the patient the nature, potential benefits, risks and alternatives to left ESWL, including side effects of the proposed treatment, the likelihood of the patient achieving the goals of the procedure, and any potential problems that might occur during the procedure or recuperation. All questions answered. Patient elects to proceed. Faris Coolman

## 2015-08-23 DIAGNOSIS — N201 Calculus of ureter: Secondary | ICD-10-CM | POA: Diagnosis not present

## 2015-08-23 DIAGNOSIS — Z0000 Encounter for general adult medical examination without abnormal findings: Secondary | ICD-10-CM | POA: Diagnosis not present

## 2015-12-03 DIAGNOSIS — I10 Essential (primary) hypertension: Secondary | ICD-10-CM | POA: Diagnosis not present

## 2015-12-03 DIAGNOSIS — E119 Type 2 diabetes mellitus without complications: Secondary | ICD-10-CM | POA: Diagnosis not present

## 2016-02-14 DIAGNOSIS — N200 Calculus of kidney: Secondary | ICD-10-CM | POA: Diagnosis not present

## 2016-02-14 DIAGNOSIS — N5201 Erectile dysfunction due to arterial insufficiency: Secondary | ICD-10-CM | POA: Diagnosis not present

## 2016-02-20 DIAGNOSIS — E119 Type 2 diabetes mellitus without complications: Secondary | ICD-10-CM | POA: Diagnosis not present

## 2016-03-03 DIAGNOSIS — Z0001 Encounter for general adult medical examination with abnormal findings: Secondary | ICD-10-CM | POA: Diagnosis not present

## 2016-03-03 DIAGNOSIS — E559 Vitamin D deficiency, unspecified: Secondary | ICD-10-CM | POA: Diagnosis not present

## 2016-03-03 DIAGNOSIS — E119 Type 2 diabetes mellitus without complications: Secondary | ICD-10-CM | POA: Diagnosis not present

## 2016-03-03 DIAGNOSIS — I10 Essential (primary) hypertension: Secondary | ICD-10-CM | POA: Diagnosis not present

## 2017-02-10 DIAGNOSIS — M25562 Pain in left knee: Secondary | ICD-10-CM | POA: Diagnosis not present

## 2017-02-10 DIAGNOSIS — M25561 Pain in right knee: Secondary | ICD-10-CM | POA: Diagnosis not present

## 2017-04-09 DIAGNOSIS — I10 Essential (primary) hypertension: Secondary | ICD-10-CM | POA: Diagnosis not present

## 2017-04-09 DIAGNOSIS — E559 Vitamin D deficiency, unspecified: Secondary | ICD-10-CM | POA: Diagnosis not present

## 2017-04-09 DIAGNOSIS — E119 Type 2 diabetes mellitus without complications: Secondary | ICD-10-CM | POA: Diagnosis not present

## 2017-04-09 DIAGNOSIS — M2550 Pain in unspecified joint: Secondary | ICD-10-CM | POA: Diagnosis not present

## 2017-05-04 DIAGNOSIS — E119 Type 2 diabetes mellitus without complications: Secondary | ICD-10-CM | POA: Diagnosis not present

## 2017-05-27 DIAGNOSIS — E119 Type 2 diabetes mellitus without complications: Secondary | ICD-10-CM | POA: Diagnosis not present

## 2017-05-27 DIAGNOSIS — I10 Essential (primary) hypertension: Secondary | ICD-10-CM | POA: Diagnosis not present

## 2017-06-18 IMAGING — CT CT ABD-PELV W/O CM
1 of 2 series · 15 of 32 positions shown, 19 images · non-contrast
Comparison: None.

CLINICAL DATA: Llq/ lt flank pain x 1 week; n/v 1 episode when pain
was severe; pt moves a lot to keep pain at a ;[place] he can tolerate;
if he sits to long sever pain; no hx stones

EXAM:
CT ABDOMEN AND PELVIS WITHOUT CONTRAST
TECHNIQUE: Multidetector CT imaging of the abdomen and pelvis was performed
following the standard protocol without IV contrast.

[Series 2: rtn ap without · axial · non-contrast · 0.84mm/px · z∈[-438,-4]mm · 15 of 97 slices shown, 19 images]
[im 5/97  soft-tissue]
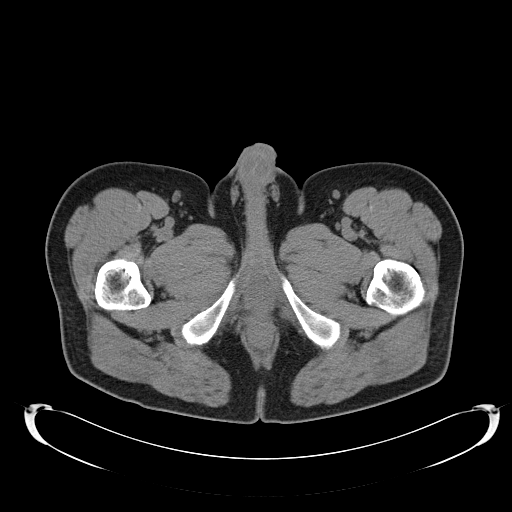
[im 5/97  bone]
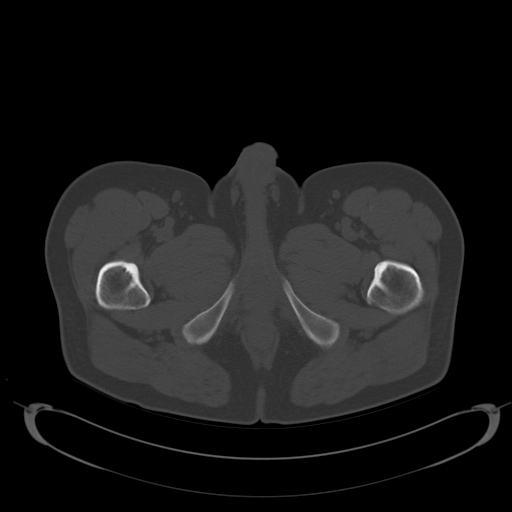
[im 13/97  soft-tissue]
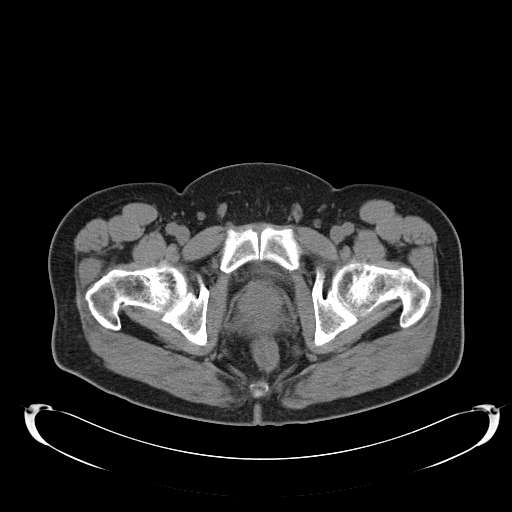
[im 21/97  soft-tissue]
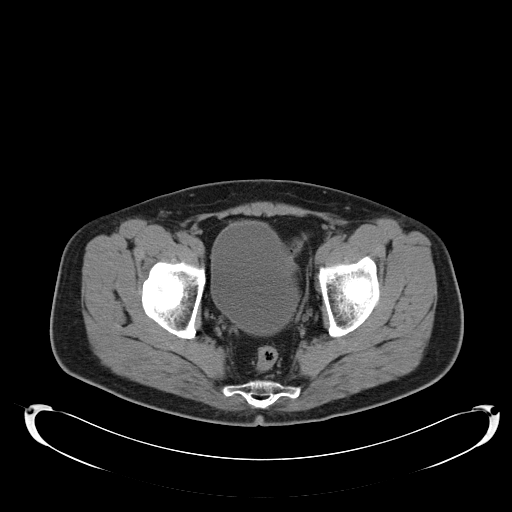
[im 26/97  soft-tissue]
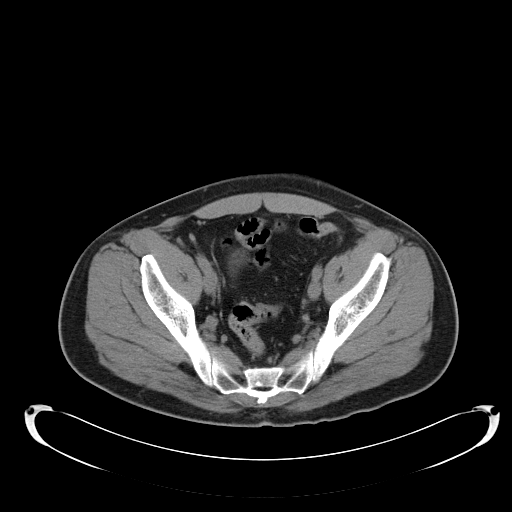
[im 34/97  soft-tissue]
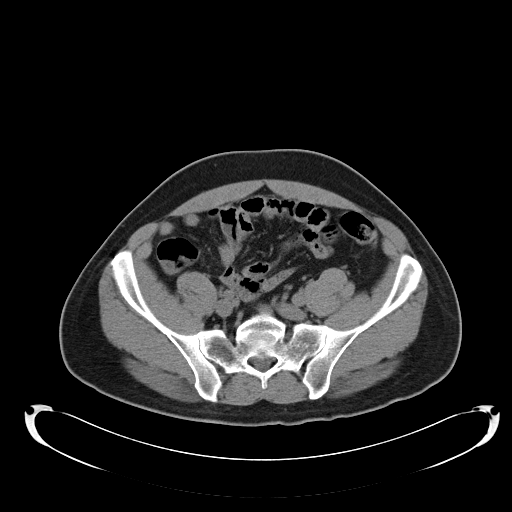
[im 42/97  soft-tissue]
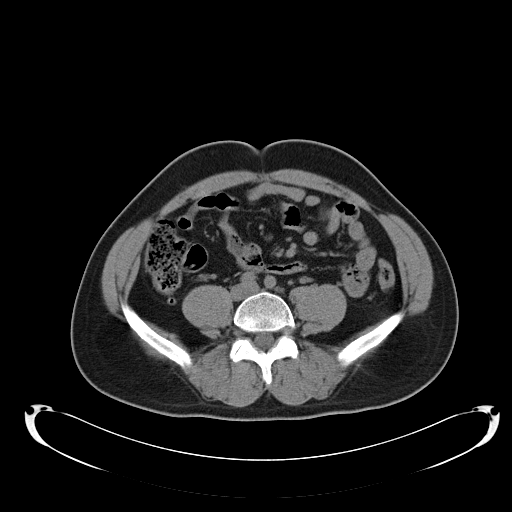
[im 51/97  soft-tissue]
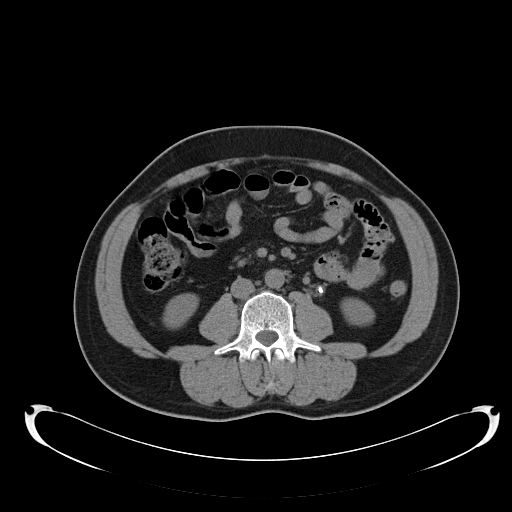
[im 55/97  soft-tissue]
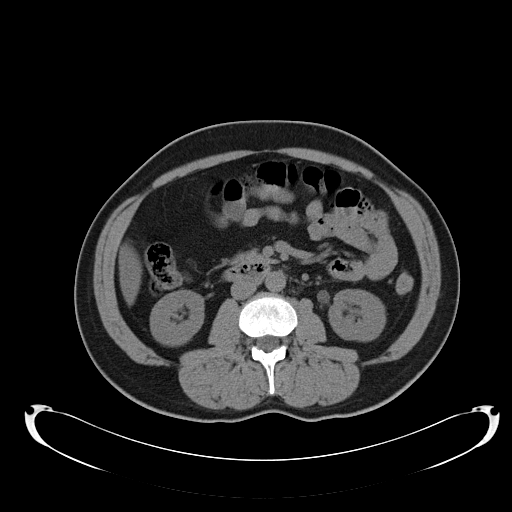
[im 63/97  soft-tissue]
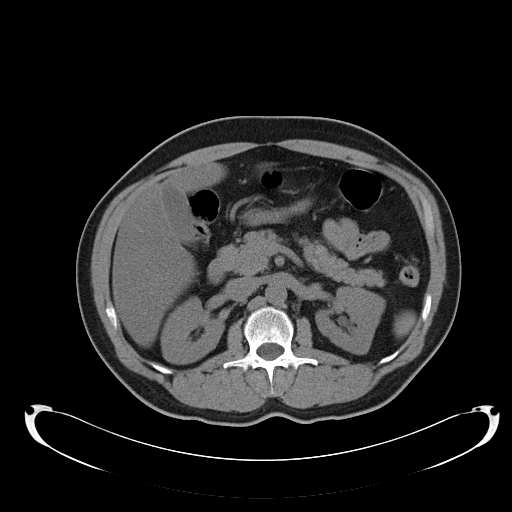
[im 63/97  bone]
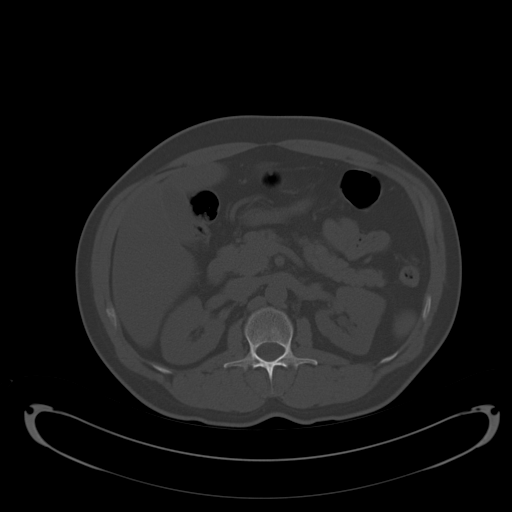
[im 71/97  soft-tissue]
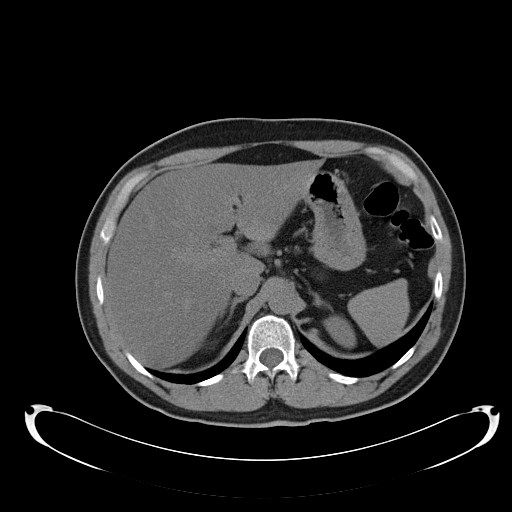
[im 76/97  soft-tissue]
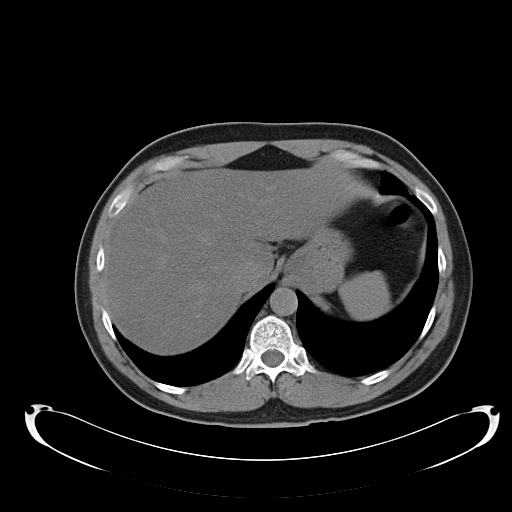
[im 80/97  lung]
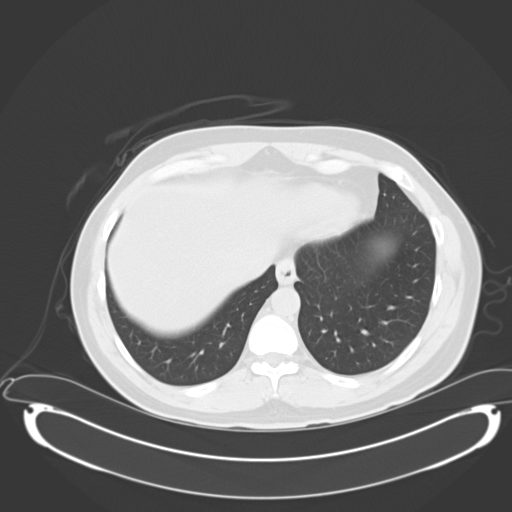
[im 84/97  soft-tissue]
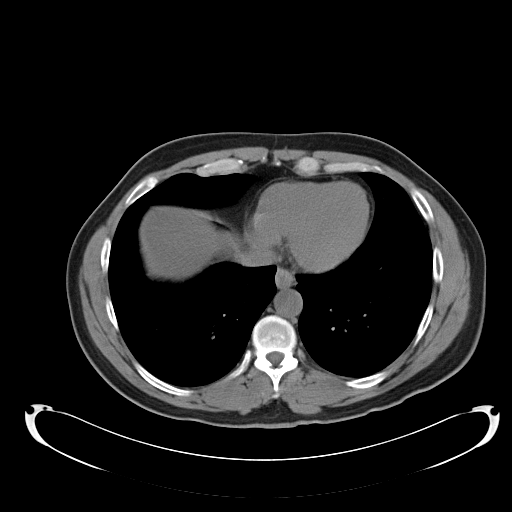
[im 84/97  lung]
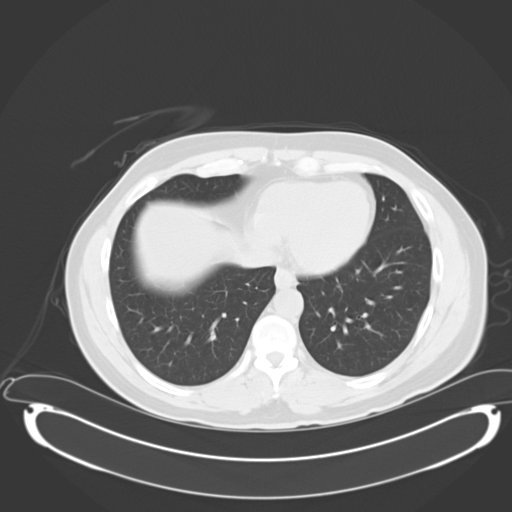
[im 88/97  lung]
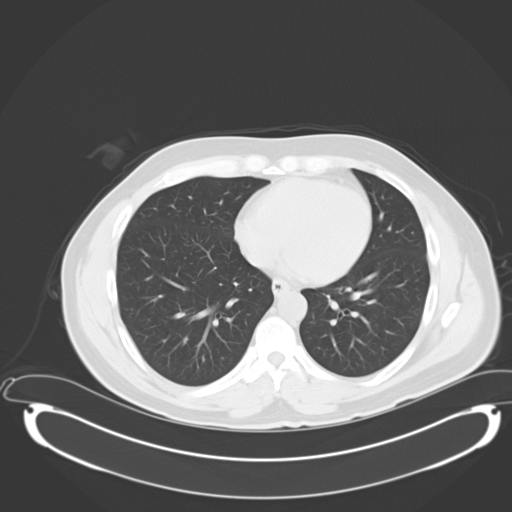
[im 92/97  soft-tissue]
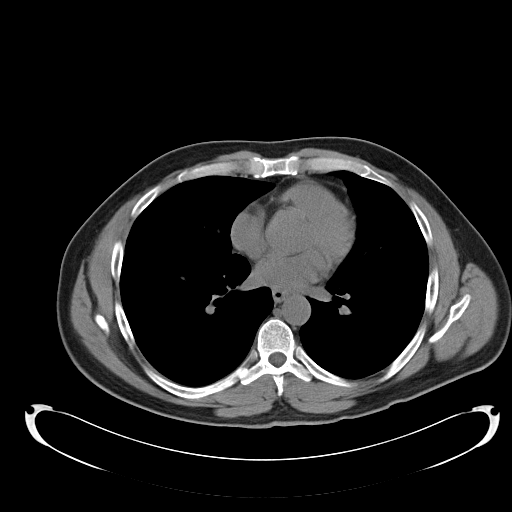
[im 92/97  lung]
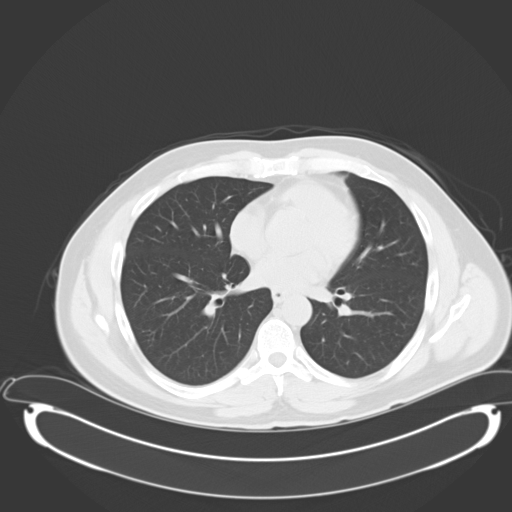

[15 of 32 positions shown; findings below may reference images not displayed]

FINDINGS: Lower chest:  No acute findings.

Hepatobiliary: Fatty liver. No focal lesion. Gallbladder nondilated.

Pancreas: No mass or inflammatory process identified on this
un-enhanced exam.

Spleen: Within normal limits in size.

Adrenals/Urinary Tract: Normal adrenal glands. Unremarkable right
kidney.

7 mm proximal left ureteral calculus with mild hydronephrosis. No
other nephrolithiasis is evident. Distally ureter is nondilated.
Urinary bladder physiologically distended.

Stomach/Bowel: No evidence of obstruction, inflammatory process, or
abnormal fluid collections. Normal appendix.

Vascular/Lymphatic: No pathologically enlarged lymph nodes. No
evidence of abdominal aortic aneurysm.

Reproductive: Mild prostatic enlargement.

Other: No ascites.  No free air.

Musculoskeletal:  No suspicious bone lesions identified.
IMPRESSION: 7 mm proximal left ureteral calculus with mild hydronephrosis.

## 2017-07-10 IMAGING — CR DG ABDOMEN 1V
2 series · 2 of 2 positions shown · non-contrast
Comparison: Radiograph 08/07/2015, CT 08/06/2015

CLINICAL DATA: Pre-litho left side stone.

EXAM:
ABDOMEN - 1 VIEW

[t abdomen supine * (1 of 2)]
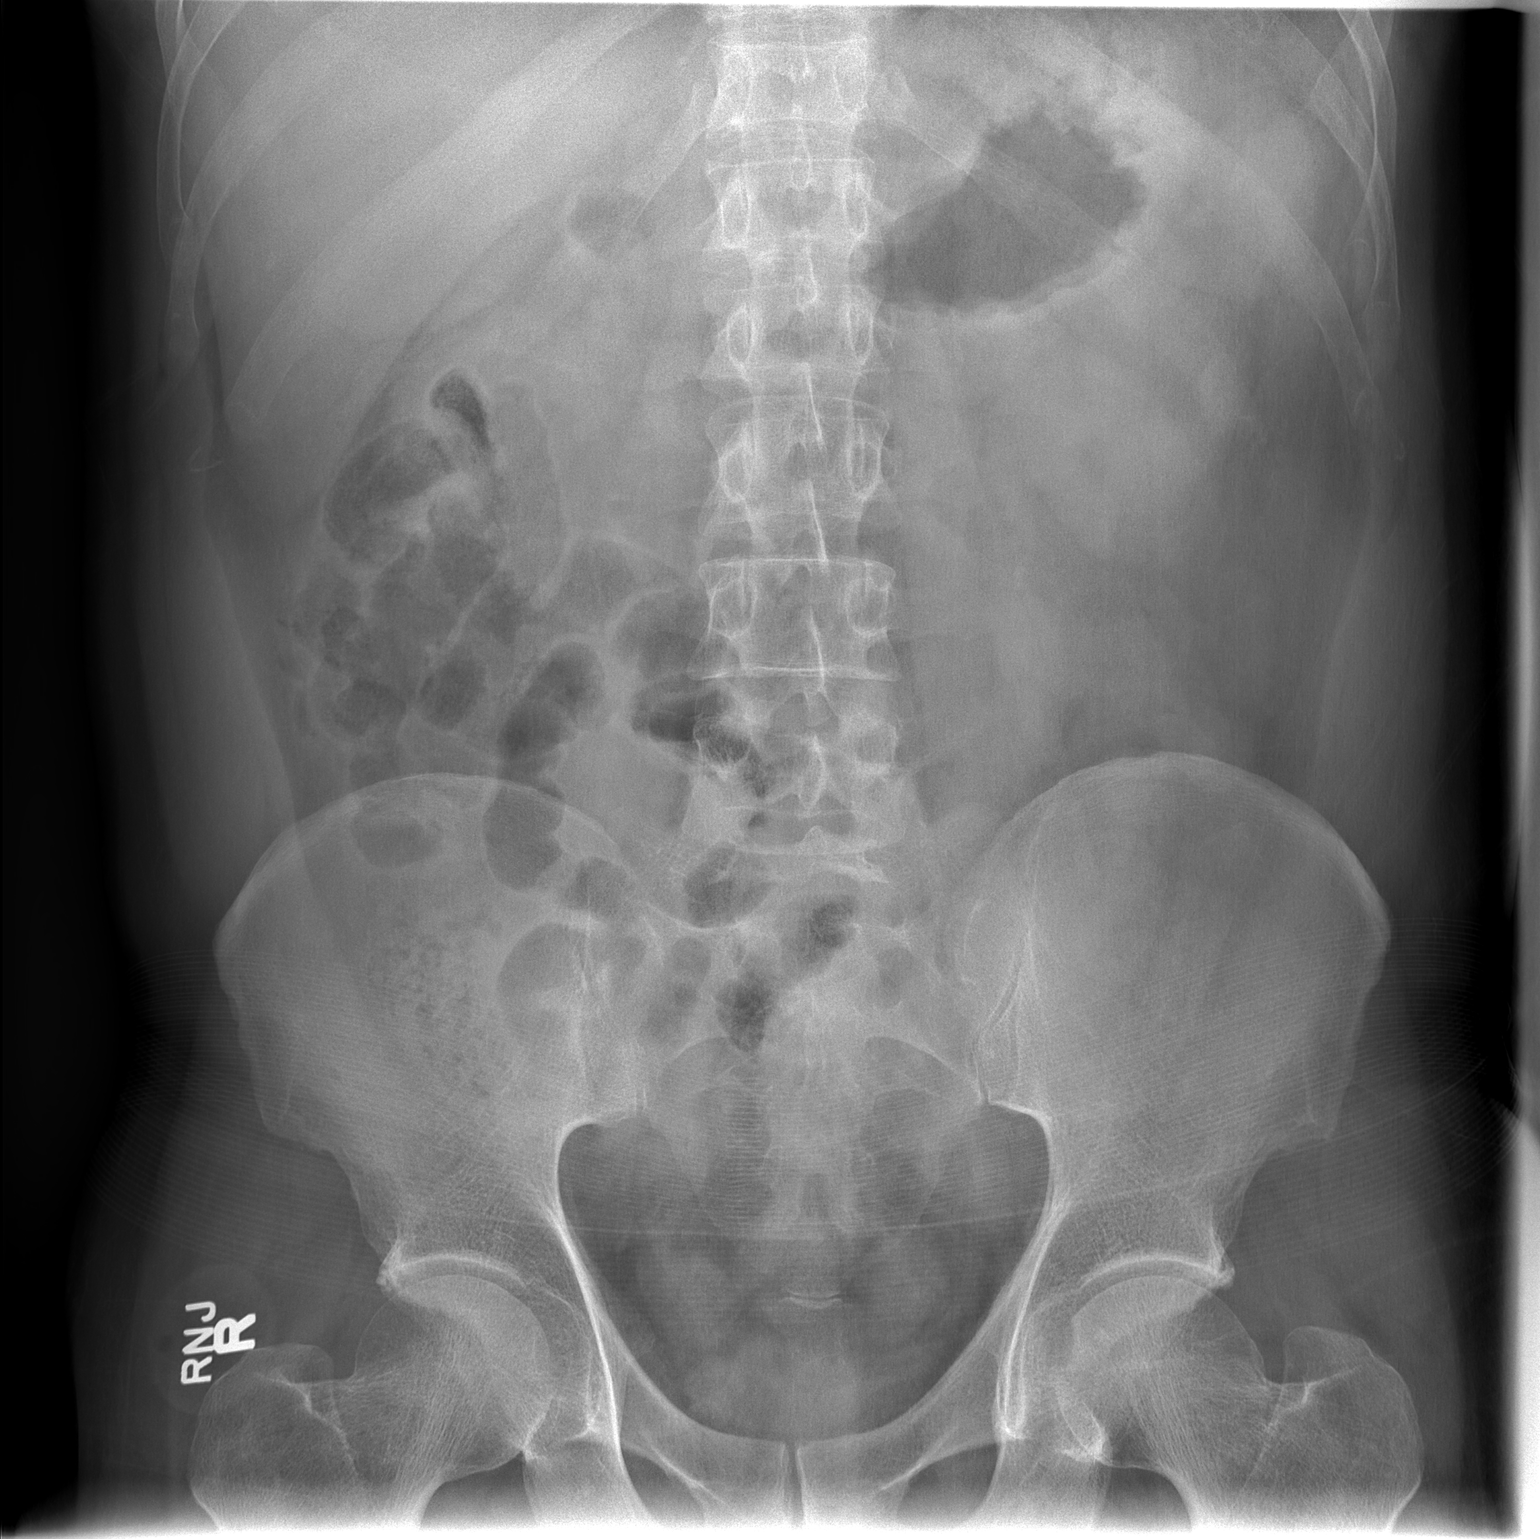

[t abdomen supine * (2 of 2)]
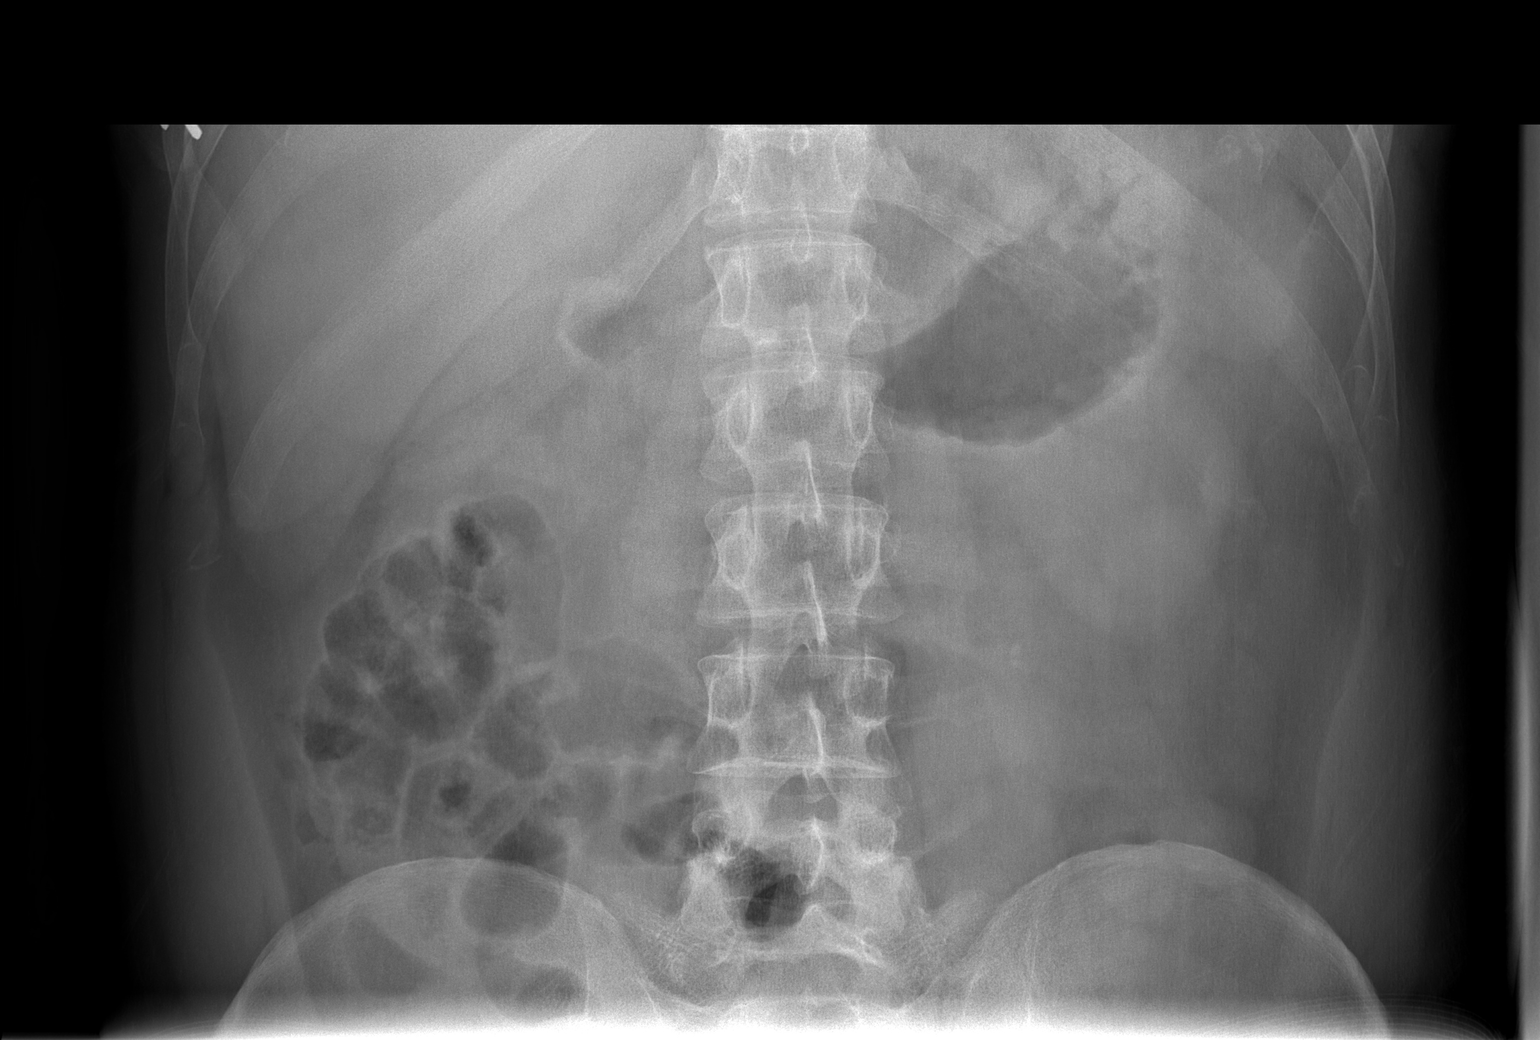

[2 of 2 positions shown; findings below may reference images not displayed]

FINDINGS: LEFT ureteral stone is faintly seen at the L3-L4 vertebral body
level. No nephrolithiasis identified.
IMPRESSION: LEFT ureteral calculus.

## 2017-08-06 DIAGNOSIS — E119 Type 2 diabetes mellitus without complications: Secondary | ICD-10-CM | POA: Diagnosis not present

## 2017-08-06 DIAGNOSIS — I10 Essential (primary) hypertension: Secondary | ICD-10-CM | POA: Diagnosis not present

## 2017-11-09 DIAGNOSIS — M5413 Radiculopathy, cervicothoracic region: Secondary | ICD-10-CM | POA: Diagnosis not present

## 2017-11-09 DIAGNOSIS — M9901 Segmental and somatic dysfunction of cervical region: Secondary | ICD-10-CM | POA: Diagnosis not present

## 2017-11-09 DIAGNOSIS — M542 Cervicalgia: Secondary | ICD-10-CM | POA: Diagnosis not present

## 2017-11-09 DIAGNOSIS — M25511 Pain in right shoulder: Secondary | ICD-10-CM | POA: Diagnosis not present

## 2017-11-10 DIAGNOSIS — M5413 Radiculopathy, cervicothoracic region: Secondary | ICD-10-CM | POA: Diagnosis not present

## 2017-11-10 DIAGNOSIS — M542 Cervicalgia: Secondary | ICD-10-CM | POA: Diagnosis not present

## 2017-11-10 DIAGNOSIS — M9901 Segmental and somatic dysfunction of cervical region: Secondary | ICD-10-CM | POA: Diagnosis not present

## 2017-11-10 DIAGNOSIS — M25511 Pain in right shoulder: Secondary | ICD-10-CM | POA: Diagnosis not present

## 2017-11-11 DIAGNOSIS — M542 Cervicalgia: Secondary | ICD-10-CM | POA: Diagnosis not present

## 2017-11-11 DIAGNOSIS — M5413 Radiculopathy, cervicothoracic region: Secondary | ICD-10-CM | POA: Diagnosis not present

## 2017-11-11 DIAGNOSIS — M25511 Pain in right shoulder: Secondary | ICD-10-CM | POA: Diagnosis not present

## 2017-11-11 DIAGNOSIS — M9901 Segmental and somatic dysfunction of cervical region: Secondary | ICD-10-CM | POA: Diagnosis not present

## 2017-11-12 DIAGNOSIS — M5413 Radiculopathy, cervicothoracic region: Secondary | ICD-10-CM | POA: Diagnosis not present

## 2017-11-12 DIAGNOSIS — M9901 Segmental and somatic dysfunction of cervical region: Secondary | ICD-10-CM | POA: Diagnosis not present

## 2017-11-12 DIAGNOSIS — M25511 Pain in right shoulder: Secondary | ICD-10-CM | POA: Diagnosis not present

## 2017-11-12 DIAGNOSIS — M542 Cervicalgia: Secondary | ICD-10-CM | POA: Diagnosis not present

## 2017-11-13 DIAGNOSIS — M25511 Pain in right shoulder: Secondary | ICD-10-CM | POA: Diagnosis not present

## 2017-11-13 DIAGNOSIS — M542 Cervicalgia: Secondary | ICD-10-CM | POA: Diagnosis not present

## 2017-11-13 DIAGNOSIS — M9901 Segmental and somatic dysfunction of cervical region: Secondary | ICD-10-CM | POA: Diagnosis not present

## 2017-11-13 DIAGNOSIS — M5413 Radiculopathy, cervicothoracic region: Secondary | ICD-10-CM | POA: Diagnosis not present

## 2017-11-16 DIAGNOSIS — M9901 Segmental and somatic dysfunction of cervical region: Secondary | ICD-10-CM | POA: Diagnosis not present

## 2017-11-16 DIAGNOSIS — M5413 Radiculopathy, cervicothoracic region: Secondary | ICD-10-CM | POA: Diagnosis not present

## 2017-11-16 DIAGNOSIS — M25511 Pain in right shoulder: Secondary | ICD-10-CM | POA: Diagnosis not present

## 2017-11-16 DIAGNOSIS — M542 Cervicalgia: Secondary | ICD-10-CM | POA: Diagnosis not present

## 2017-11-19 DIAGNOSIS — M5413 Radiculopathy, cervicothoracic region: Secondary | ICD-10-CM | POA: Diagnosis not present

## 2017-11-19 DIAGNOSIS — M25511 Pain in right shoulder: Secondary | ICD-10-CM | POA: Diagnosis not present

## 2017-11-19 DIAGNOSIS — M542 Cervicalgia: Secondary | ICD-10-CM | POA: Diagnosis not present

## 2017-11-19 DIAGNOSIS — M9901 Segmental and somatic dysfunction of cervical region: Secondary | ICD-10-CM | POA: Diagnosis not present

## 2017-11-25 DIAGNOSIS — M9901 Segmental and somatic dysfunction of cervical region: Secondary | ICD-10-CM | POA: Diagnosis not present

## 2017-11-25 DIAGNOSIS — M25511 Pain in right shoulder: Secondary | ICD-10-CM | POA: Diagnosis not present

## 2017-11-25 DIAGNOSIS — M542 Cervicalgia: Secondary | ICD-10-CM | POA: Diagnosis not present

## 2017-11-25 DIAGNOSIS — M5413 Radiculopathy, cervicothoracic region: Secondary | ICD-10-CM | POA: Diagnosis not present

## 2017-11-30 DIAGNOSIS — M542 Cervicalgia: Secondary | ICD-10-CM | POA: Diagnosis not present

## 2017-11-30 DIAGNOSIS — M9901 Segmental and somatic dysfunction of cervical region: Secondary | ICD-10-CM | POA: Diagnosis not present

## 2017-11-30 DIAGNOSIS — M25511 Pain in right shoulder: Secondary | ICD-10-CM | POA: Diagnosis not present

## 2017-11-30 DIAGNOSIS — M5413 Radiculopathy, cervicothoracic region: Secondary | ICD-10-CM | POA: Diagnosis not present

## 2017-12-04 DIAGNOSIS — M25511 Pain in right shoulder: Secondary | ICD-10-CM | POA: Diagnosis not present

## 2017-12-04 DIAGNOSIS — M542 Cervicalgia: Secondary | ICD-10-CM | POA: Diagnosis not present

## 2017-12-04 DIAGNOSIS — M9901 Segmental and somatic dysfunction of cervical region: Secondary | ICD-10-CM | POA: Diagnosis not present

## 2017-12-04 DIAGNOSIS — M5413 Radiculopathy, cervicothoracic region: Secondary | ICD-10-CM | POA: Diagnosis not present

## 2017-12-11 DIAGNOSIS — M25511 Pain in right shoulder: Secondary | ICD-10-CM | POA: Diagnosis not present

## 2017-12-11 DIAGNOSIS — M9901 Segmental and somatic dysfunction of cervical region: Secondary | ICD-10-CM | POA: Diagnosis not present

## 2017-12-11 DIAGNOSIS — M5413 Radiculopathy, cervicothoracic region: Secondary | ICD-10-CM | POA: Diagnosis not present

## 2017-12-11 DIAGNOSIS — M542 Cervicalgia: Secondary | ICD-10-CM | POA: Diagnosis not present

## 2017-12-18 DIAGNOSIS — M542 Cervicalgia: Secondary | ICD-10-CM | POA: Diagnosis not present

## 2017-12-18 DIAGNOSIS — M25511 Pain in right shoulder: Secondary | ICD-10-CM | POA: Diagnosis not present

## 2017-12-18 DIAGNOSIS — M9901 Segmental and somatic dysfunction of cervical region: Secondary | ICD-10-CM | POA: Diagnosis not present

## 2017-12-18 DIAGNOSIS — M5413 Radiculopathy, cervicothoracic region: Secondary | ICD-10-CM | POA: Diagnosis not present

## 2017-12-30 DIAGNOSIS — M542 Cervicalgia: Secondary | ICD-10-CM | POA: Diagnosis not present

## 2017-12-30 DIAGNOSIS — M25511 Pain in right shoulder: Secondary | ICD-10-CM | POA: Diagnosis not present

## 2017-12-30 DIAGNOSIS — M9901 Segmental and somatic dysfunction of cervical region: Secondary | ICD-10-CM | POA: Diagnosis not present

## 2017-12-30 DIAGNOSIS — M5413 Radiculopathy, cervicothoracic region: Secondary | ICD-10-CM | POA: Diagnosis not present

## 2018-01-12 DIAGNOSIS — N5201 Erectile dysfunction due to arterial insufficiency: Secondary | ICD-10-CM | POA: Diagnosis not present

## 2018-01-12 DIAGNOSIS — Z87442 Personal history of urinary calculi: Secondary | ICD-10-CM | POA: Diagnosis not present

## 2018-03-15 DIAGNOSIS — E119 Type 2 diabetes mellitus without complications: Secondary | ICD-10-CM | POA: Diagnosis not present

## 2018-03-15 DIAGNOSIS — Z6825 Body mass index (BMI) 25.0-25.9, adult: Secondary | ICD-10-CM | POA: Diagnosis not present

## 2018-03-15 DIAGNOSIS — Z0000 Encounter for general adult medical examination without abnormal findings: Secondary | ICD-10-CM | POA: Diagnosis not present

## 2018-03-17 DIAGNOSIS — E119 Type 2 diabetes mellitus without complications: Secondary | ICD-10-CM | POA: Diagnosis not present

## 2018-11-26 DIAGNOSIS — M7912 Myalgia of auxiliary muscles, head and neck: Secondary | ICD-10-CM | POA: Diagnosis not present

## 2018-11-26 DIAGNOSIS — R51 Headache: Secondary | ICD-10-CM | POA: Diagnosis not present

## 2018-11-26 DIAGNOSIS — M5412 Radiculopathy, cervical region: Secondary | ICD-10-CM | POA: Diagnosis not present

## 2018-11-26 DIAGNOSIS — M5413 Radiculopathy, cervicothoracic region: Secondary | ICD-10-CM | POA: Diagnosis not present

## 2018-12-02 DIAGNOSIS — M5413 Radiculopathy, cervicothoracic region: Secondary | ICD-10-CM | POA: Diagnosis not present

## 2018-12-02 DIAGNOSIS — M7912 Myalgia of auxiliary muscles, head and neck: Secondary | ICD-10-CM | POA: Diagnosis not present

## 2018-12-02 DIAGNOSIS — M5412 Radiculopathy, cervical region: Secondary | ICD-10-CM | POA: Diagnosis not present

## 2018-12-02 DIAGNOSIS — R51 Headache: Secondary | ICD-10-CM | POA: Diagnosis not present

## 2018-12-07 DIAGNOSIS — M7912 Myalgia of auxiliary muscles, head and neck: Secondary | ICD-10-CM | POA: Diagnosis not present

## 2018-12-07 DIAGNOSIS — M5412 Radiculopathy, cervical region: Secondary | ICD-10-CM | POA: Diagnosis not present

## 2018-12-07 DIAGNOSIS — R51 Headache: Secondary | ICD-10-CM | POA: Diagnosis not present

## 2018-12-07 DIAGNOSIS — M5413 Radiculopathy, cervicothoracic region: Secondary | ICD-10-CM | POA: Diagnosis not present

## 2018-12-15 DIAGNOSIS — M5412 Radiculopathy, cervical region: Secondary | ICD-10-CM | POA: Diagnosis not present

## 2018-12-15 DIAGNOSIS — R51 Headache: Secondary | ICD-10-CM | POA: Diagnosis not present

## 2018-12-15 DIAGNOSIS — M5413 Radiculopathy, cervicothoracic region: Secondary | ICD-10-CM | POA: Diagnosis not present

## 2018-12-15 DIAGNOSIS — M7912 Myalgia of auxiliary muscles, head and neck: Secondary | ICD-10-CM | POA: Diagnosis not present

## 2018-12-22 DIAGNOSIS — M5412 Radiculopathy, cervical region: Secondary | ICD-10-CM | POA: Diagnosis not present

## 2018-12-22 DIAGNOSIS — M5413 Radiculopathy, cervicothoracic region: Secondary | ICD-10-CM | POA: Diagnosis not present

## 2018-12-22 DIAGNOSIS — M7912 Myalgia of auxiliary muscles, head and neck: Secondary | ICD-10-CM | POA: Diagnosis not present

## 2018-12-22 DIAGNOSIS — R51 Headache: Secondary | ICD-10-CM | POA: Diagnosis not present

## 2018-12-30 DIAGNOSIS — M5413 Radiculopathy, cervicothoracic region: Secondary | ICD-10-CM | POA: Diagnosis not present

## 2018-12-30 DIAGNOSIS — M542 Cervicalgia: Secondary | ICD-10-CM | POA: Diagnosis not present

## 2018-12-30 DIAGNOSIS — M25512 Pain in left shoulder: Secondary | ICD-10-CM | POA: Diagnosis not present

## 2018-12-30 DIAGNOSIS — M9901 Segmental and somatic dysfunction of cervical region: Secondary | ICD-10-CM | POA: Diagnosis not present

## 2019-01-05 DIAGNOSIS — M5413 Radiculopathy, cervicothoracic region: Secondary | ICD-10-CM | POA: Diagnosis not present

## 2019-01-05 DIAGNOSIS — M25512 Pain in left shoulder: Secondary | ICD-10-CM | POA: Diagnosis not present

## 2019-01-05 DIAGNOSIS — M542 Cervicalgia: Secondary | ICD-10-CM | POA: Diagnosis not present

## 2019-01-05 DIAGNOSIS — M9901 Segmental and somatic dysfunction of cervical region: Secondary | ICD-10-CM | POA: Diagnosis not present

## 2019-01-12 DIAGNOSIS — M5413 Radiculopathy, cervicothoracic region: Secondary | ICD-10-CM | POA: Diagnosis not present

## 2019-01-12 DIAGNOSIS — M542 Cervicalgia: Secondary | ICD-10-CM | POA: Diagnosis not present

## 2019-01-12 DIAGNOSIS — M25512 Pain in left shoulder: Secondary | ICD-10-CM | POA: Diagnosis not present

## 2019-01-12 DIAGNOSIS — M9901 Segmental and somatic dysfunction of cervical region: Secondary | ICD-10-CM | POA: Diagnosis not present

## 2019-01-19 DIAGNOSIS — M5413 Radiculopathy, cervicothoracic region: Secondary | ICD-10-CM | POA: Diagnosis not present

## 2019-01-19 DIAGNOSIS — M9901 Segmental and somatic dysfunction of cervical region: Secondary | ICD-10-CM | POA: Diagnosis not present

## 2019-01-19 DIAGNOSIS — M542 Cervicalgia: Secondary | ICD-10-CM | POA: Diagnosis not present

## 2019-01-19 DIAGNOSIS — M25512 Pain in left shoulder: Secondary | ICD-10-CM | POA: Diagnosis not present

## 2019-01-26 DIAGNOSIS — M9901 Segmental and somatic dysfunction of cervical region: Secondary | ICD-10-CM | POA: Diagnosis not present

## 2019-01-26 DIAGNOSIS — M25512 Pain in left shoulder: Secondary | ICD-10-CM | POA: Diagnosis not present

## 2019-01-26 DIAGNOSIS — M5413 Radiculopathy, cervicothoracic region: Secondary | ICD-10-CM | POA: Diagnosis not present

## 2019-01-26 DIAGNOSIS — M542 Cervicalgia: Secondary | ICD-10-CM | POA: Diagnosis not present

## 2019-02-02 DIAGNOSIS — M25512 Pain in left shoulder: Secondary | ICD-10-CM | POA: Diagnosis not present

## 2019-02-02 DIAGNOSIS — M5413 Radiculopathy, cervicothoracic region: Secondary | ICD-10-CM | POA: Diagnosis not present

## 2019-02-02 DIAGNOSIS — M542 Cervicalgia: Secondary | ICD-10-CM | POA: Diagnosis not present

## 2019-02-02 DIAGNOSIS — M9901 Segmental and somatic dysfunction of cervical region: Secondary | ICD-10-CM | POA: Diagnosis not present

## 2019-02-10 DIAGNOSIS — M9901 Segmental and somatic dysfunction of cervical region: Secondary | ICD-10-CM | POA: Diagnosis not present

## 2019-02-10 DIAGNOSIS — M542 Cervicalgia: Secondary | ICD-10-CM | POA: Diagnosis not present

## 2019-02-10 DIAGNOSIS — M25512 Pain in left shoulder: Secondary | ICD-10-CM | POA: Diagnosis not present

## 2019-02-10 DIAGNOSIS — M5413 Radiculopathy, cervicothoracic region: Secondary | ICD-10-CM | POA: Diagnosis not present

## 2019-02-23 DIAGNOSIS — M25512 Pain in left shoulder: Secondary | ICD-10-CM | POA: Diagnosis not present

## 2019-02-23 DIAGNOSIS — M5413 Radiculopathy, cervicothoracic region: Secondary | ICD-10-CM | POA: Diagnosis not present

## 2019-02-23 DIAGNOSIS — M9901 Segmental and somatic dysfunction of cervical region: Secondary | ICD-10-CM | POA: Diagnosis not present

## 2019-02-23 DIAGNOSIS — M542 Cervicalgia: Secondary | ICD-10-CM | POA: Diagnosis not present

## 2019-02-24 DIAGNOSIS — Z87442 Personal history of urinary calculi: Secondary | ICD-10-CM | POA: Diagnosis not present

## 2019-02-24 DIAGNOSIS — N5201 Erectile dysfunction due to arterial insufficiency: Secondary | ICD-10-CM | POA: Diagnosis not present

## 2019-03-02 DIAGNOSIS — E119 Type 2 diabetes mellitus without complications: Secondary | ICD-10-CM | POA: Diagnosis not present

## 2019-04-01 DIAGNOSIS — N529 Male erectile dysfunction, unspecified: Secondary | ICD-10-CM | POA: Diagnosis not present

## 2019-04-01 DIAGNOSIS — I10 Essential (primary) hypertension: Secondary | ICD-10-CM | POA: Diagnosis not present

## 2019-04-01 DIAGNOSIS — Z0000 Encounter for general adult medical examination without abnormal findings: Secondary | ICD-10-CM | POA: Diagnosis not present

## 2019-04-01 DIAGNOSIS — E559 Vitamin D deficiency, unspecified: Secondary | ICD-10-CM | POA: Diagnosis not present

## 2019-04-01 DIAGNOSIS — Z125 Encounter for screening for malignant neoplasm of prostate: Secondary | ICD-10-CM | POA: Diagnosis not present

## 2019-04-01 DIAGNOSIS — E1169 Type 2 diabetes mellitus with other specified complication: Secondary | ICD-10-CM | POA: Diagnosis not present

## 2019-06-07 DIAGNOSIS — J029 Acute pharyngitis, unspecified: Secondary | ICD-10-CM | POA: Diagnosis not present

## 2019-06-07 DIAGNOSIS — H9201 Otalgia, right ear: Secondary | ICD-10-CM | POA: Diagnosis not present

## 2019-12-13 DIAGNOSIS — R300 Dysuria: Secondary | ICD-10-CM | POA: Diagnosis not present

## 2019-12-13 DIAGNOSIS — B3749 Other urogenital candidiasis: Secondary | ICD-10-CM | POA: Diagnosis not present

## 2019-12-13 DIAGNOSIS — N411 Chronic prostatitis: Secondary | ICD-10-CM | POA: Diagnosis not present

## 2019-12-13 DIAGNOSIS — N5201 Erectile dysfunction due to arterial insufficiency: Secondary | ICD-10-CM | POA: Diagnosis not present

## 2020-02-29 DIAGNOSIS — E119 Type 2 diabetes mellitus without complications: Secondary | ICD-10-CM | POA: Diagnosis not present

## 2020-04-06 DIAGNOSIS — E782 Mixed hyperlipidemia: Secondary | ICD-10-CM | POA: Diagnosis not present

## 2020-04-06 DIAGNOSIS — I10 Essential (primary) hypertension: Secondary | ICD-10-CM | POA: Diagnosis not present

## 2020-04-06 DIAGNOSIS — G4719 Other hypersomnia: Secondary | ICD-10-CM | POA: Diagnosis not present

## 2020-04-06 DIAGNOSIS — E559 Vitamin D deficiency, unspecified: Secondary | ICD-10-CM | POA: Diagnosis not present

## 2020-04-06 DIAGNOSIS — E1169 Type 2 diabetes mellitus with other specified complication: Secondary | ICD-10-CM | POA: Diagnosis not present

## 2020-04-06 DIAGNOSIS — Z0000 Encounter for general adult medical examination without abnormal findings: Secondary | ICD-10-CM | POA: Diagnosis not present

## 2020-05-03 DIAGNOSIS — G4733 Obstructive sleep apnea (adult) (pediatric): Secondary | ICD-10-CM | POA: Diagnosis not present

## 2020-06-01 DIAGNOSIS — H35031 Hypertensive retinopathy, right eye: Secondary | ICD-10-CM | POA: Diagnosis not present

## 2020-08-23 DIAGNOSIS — I10 Essential (primary) hypertension: Secondary | ICD-10-CM | POA: Diagnosis not present

## 2020-08-23 DIAGNOSIS — E119 Type 2 diabetes mellitus without complications: Secondary | ICD-10-CM | POA: Diagnosis not present

## 2020-08-23 DIAGNOSIS — G4733 Obstructive sleep apnea (adult) (pediatric): Secondary | ICD-10-CM | POA: Diagnosis not present

## 2020-10-10 DIAGNOSIS — E559 Vitamin D deficiency, unspecified: Secondary | ICD-10-CM | POA: Diagnosis not present

## 2020-10-10 DIAGNOSIS — E1169 Type 2 diabetes mellitus with other specified complication: Secondary | ICD-10-CM | POA: Diagnosis not present

## 2020-10-10 DIAGNOSIS — I10 Essential (primary) hypertension: Secondary | ICD-10-CM | POA: Diagnosis not present

## 2020-10-10 DIAGNOSIS — R5383 Other fatigue: Secondary | ICD-10-CM | POA: Diagnosis not present

## 2020-10-10 DIAGNOSIS — E291 Testicular hypofunction: Secondary | ICD-10-CM | POA: Diagnosis not present

## 2020-10-10 DIAGNOSIS — E782 Mixed hyperlipidemia: Secondary | ICD-10-CM | POA: Diagnosis not present

## 2020-10-16 DIAGNOSIS — E291 Testicular hypofunction: Secondary | ICD-10-CM | POA: Diagnosis not present

## 2020-11-29 DIAGNOSIS — F5112 Insufficient sleep syndrome: Secondary | ICD-10-CM | POA: Diagnosis not present

## 2020-11-29 DIAGNOSIS — E1169 Type 2 diabetes mellitus with other specified complication: Secondary | ICD-10-CM | POA: Diagnosis not present

## 2020-11-29 DIAGNOSIS — G4733 Obstructive sleep apnea (adult) (pediatric): Secondary | ICD-10-CM | POA: Diagnosis not present

## 2020-12-11 DIAGNOSIS — E349 Endocrine disorder, unspecified: Secondary | ICD-10-CM | POA: Diagnosis not present

## 2020-12-11 DIAGNOSIS — N5201 Erectile dysfunction due to arterial insufficiency: Secondary | ICD-10-CM | POA: Diagnosis not present

## 2021-01-02 DIAGNOSIS — G4733 Obstructive sleep apnea (adult) (pediatric): Secondary | ICD-10-CM | POA: Diagnosis not present

## 2021-01-10 DIAGNOSIS — E118 Type 2 diabetes mellitus with unspecified complications: Secondary | ICD-10-CM | POA: Diagnosis not present

## 2021-02-01 DIAGNOSIS — G4733 Obstructive sleep apnea (adult) (pediatric): Secondary | ICD-10-CM | POA: Diagnosis not present

## 2021-03-04 DIAGNOSIS — G4733 Obstructive sleep apnea (adult) (pediatric): Secondary | ICD-10-CM | POA: Diagnosis not present

## 2021-03-12 DIAGNOSIS — E118 Type 2 diabetes mellitus with unspecified complications: Secondary | ICD-10-CM | POA: Diagnosis not present

## 2021-03-20 DIAGNOSIS — E113291 Type 2 diabetes mellitus with mild nonproliferative diabetic retinopathy without macular edema, right eye: Secondary | ICD-10-CM | POA: Diagnosis not present

## 2022-10-15 DIAGNOSIS — M542 Cervicalgia: Secondary | ICD-10-CM | POA: Diagnosis not present

## 2022-10-15 DIAGNOSIS — M7918 Myalgia, other site: Secondary | ICD-10-CM | POA: Diagnosis not present

## 2022-10-15 DIAGNOSIS — M9901 Segmental and somatic dysfunction of cervical region: Secondary | ICD-10-CM | POA: Diagnosis not present

## 2022-10-15 DIAGNOSIS — M9904 Segmental and somatic dysfunction of sacral region: Secondary | ICD-10-CM | POA: Diagnosis not present

## 2022-10-15 DIAGNOSIS — M5451 Vertebrogenic low back pain: Secondary | ICD-10-CM | POA: Diagnosis not present

## 2022-10-15 DIAGNOSIS — M9903 Segmental and somatic dysfunction of lumbar region: Secondary | ICD-10-CM | POA: Diagnosis not present

## 2022-10-15 DIAGNOSIS — M25512 Pain in left shoulder: Secondary | ICD-10-CM | POA: Diagnosis not present

## 2022-10-16 DIAGNOSIS — E1169 Type 2 diabetes mellitus with other specified complication: Secondary | ICD-10-CM | POA: Diagnosis not present

## 2022-10-16 DIAGNOSIS — E782 Mixed hyperlipidemia: Secondary | ICD-10-CM | POA: Diagnosis not present

## 2022-10-16 DIAGNOSIS — I10 Essential (primary) hypertension: Secondary | ICD-10-CM | POA: Diagnosis not present

## 2022-10-16 DIAGNOSIS — E291 Testicular hypofunction: Secondary | ICD-10-CM | POA: Diagnosis not present

## 2022-10-16 DIAGNOSIS — N529 Male erectile dysfunction, unspecified: Secondary | ICD-10-CM | POA: Diagnosis not present

## 2022-11-13 DIAGNOSIS — M9904 Segmental and somatic dysfunction of sacral region: Secondary | ICD-10-CM | POA: Diagnosis not present

## 2022-11-13 DIAGNOSIS — M542 Cervicalgia: Secondary | ICD-10-CM | POA: Diagnosis not present

## 2022-11-13 DIAGNOSIS — M7918 Myalgia, other site: Secondary | ICD-10-CM | POA: Diagnosis not present

## 2022-11-13 DIAGNOSIS — M5451 Vertebrogenic low back pain: Secondary | ICD-10-CM | POA: Diagnosis not present

## 2022-11-13 DIAGNOSIS — M9903 Segmental and somatic dysfunction of lumbar region: Secondary | ICD-10-CM | POA: Diagnosis not present

## 2022-11-13 DIAGNOSIS — M9901 Segmental and somatic dysfunction of cervical region: Secondary | ICD-10-CM | POA: Diagnosis not present

## 2022-11-13 DIAGNOSIS — M25512 Pain in left shoulder: Secondary | ICD-10-CM | POA: Diagnosis not present

## 2022-11-20 DIAGNOSIS — M9903 Segmental and somatic dysfunction of lumbar region: Secondary | ICD-10-CM | POA: Diagnosis not present

## 2022-11-20 DIAGNOSIS — M9904 Segmental and somatic dysfunction of sacral region: Secondary | ICD-10-CM | POA: Diagnosis not present

## 2022-11-20 DIAGNOSIS — M9901 Segmental and somatic dysfunction of cervical region: Secondary | ICD-10-CM | POA: Diagnosis not present

## 2022-11-20 DIAGNOSIS — M25512 Pain in left shoulder: Secondary | ICD-10-CM | POA: Diagnosis not present

## 2022-11-20 DIAGNOSIS — M5451 Vertebrogenic low back pain: Secondary | ICD-10-CM | POA: Diagnosis not present

## 2022-11-20 DIAGNOSIS — M542 Cervicalgia: Secondary | ICD-10-CM | POA: Diagnosis not present

## 2022-11-20 DIAGNOSIS — M7918 Myalgia, other site: Secondary | ICD-10-CM | POA: Diagnosis not present

## 2022-11-27 DIAGNOSIS — I10 Essential (primary) hypertension: Secondary | ICD-10-CM | POA: Diagnosis not present

## 2022-11-27 DIAGNOSIS — G4733 Obstructive sleep apnea (adult) (pediatric): Secondary | ICD-10-CM | POA: Diagnosis not present

## 2022-11-27 DIAGNOSIS — E1169 Type 2 diabetes mellitus with other specified complication: Secondary | ICD-10-CM | POA: Diagnosis not present

## 2022-11-28 DIAGNOSIS — M5451 Vertebrogenic low back pain: Secondary | ICD-10-CM | POA: Diagnosis not present

## 2022-11-28 DIAGNOSIS — M25512 Pain in left shoulder: Secondary | ICD-10-CM | POA: Diagnosis not present

## 2022-11-28 DIAGNOSIS — M9901 Segmental and somatic dysfunction of cervical region: Secondary | ICD-10-CM | POA: Diagnosis not present

## 2022-11-28 DIAGNOSIS — M7918 Myalgia, other site: Secondary | ICD-10-CM | POA: Diagnosis not present

## 2022-11-28 DIAGNOSIS — M9904 Segmental and somatic dysfunction of sacral region: Secondary | ICD-10-CM | POA: Diagnosis not present

## 2022-11-28 DIAGNOSIS — M9903 Segmental and somatic dysfunction of lumbar region: Secondary | ICD-10-CM | POA: Diagnosis not present

## 2022-11-28 DIAGNOSIS — M542 Cervicalgia: Secondary | ICD-10-CM | POA: Diagnosis not present

## 2022-12-04 DIAGNOSIS — M7918 Myalgia, other site: Secondary | ICD-10-CM | POA: Diagnosis not present

## 2022-12-04 DIAGNOSIS — M9904 Segmental and somatic dysfunction of sacral region: Secondary | ICD-10-CM | POA: Diagnosis not present

## 2022-12-04 DIAGNOSIS — M25512 Pain in left shoulder: Secondary | ICD-10-CM | POA: Diagnosis not present

## 2022-12-04 DIAGNOSIS — M9901 Segmental and somatic dysfunction of cervical region: Secondary | ICD-10-CM | POA: Diagnosis not present

## 2022-12-04 DIAGNOSIS — M9903 Segmental and somatic dysfunction of lumbar region: Secondary | ICD-10-CM | POA: Diagnosis not present

## 2022-12-04 DIAGNOSIS — M5451 Vertebrogenic low back pain: Secondary | ICD-10-CM | POA: Diagnosis not present

## 2022-12-04 DIAGNOSIS — M542 Cervicalgia: Secondary | ICD-10-CM | POA: Diagnosis not present

## 2023-02-06 DIAGNOSIS — M9901 Segmental and somatic dysfunction of cervical region: Secondary | ICD-10-CM | POA: Diagnosis not present

## 2023-02-06 DIAGNOSIS — M9904 Segmental and somatic dysfunction of sacral region: Secondary | ICD-10-CM | POA: Diagnosis not present

## 2023-02-06 DIAGNOSIS — M9903 Segmental and somatic dysfunction of lumbar region: Secondary | ICD-10-CM | POA: Diagnosis not present

## 2023-02-06 DIAGNOSIS — M5451 Vertebrogenic low back pain: Secondary | ICD-10-CM | POA: Diagnosis not present

## 2023-02-06 DIAGNOSIS — M7918 Myalgia, other site: Secondary | ICD-10-CM | POA: Diagnosis not present

## 2023-02-06 DIAGNOSIS — M25512 Pain in left shoulder: Secondary | ICD-10-CM | POA: Diagnosis not present

## 2023-02-06 DIAGNOSIS — M542 Cervicalgia: Secondary | ICD-10-CM | POA: Diagnosis not present
